# Patient Record
Sex: Female | Born: 1990 | Race: White | Hispanic: No | Marital: Married | Smoking: Current some day smoker
Health system: Southern US, Community
[De-identification: ages and names within clinical notes are randomized; demographics above are authoritative.]

## PROBLEM LIST (undated history)

## (undated) DIAGNOSIS — N12 Tubulo-interstitial nephritis, not specified as acute or chronic: Secondary | ICD-10-CM

## (undated) DIAGNOSIS — N946 Dysmenorrhea, unspecified: Secondary | ICD-10-CM

## (undated) DIAGNOSIS — T7840XA Allergy, unspecified, initial encounter: Secondary | ICD-10-CM

## (undated) DIAGNOSIS — F329 Major depressive disorder, single episode, unspecified: Secondary | ICD-10-CM

## (undated) DIAGNOSIS — F32A Depression, unspecified: Secondary | ICD-10-CM

## (undated) DIAGNOSIS — F419 Anxiety disorder, unspecified: Secondary | ICD-10-CM

## (undated) DIAGNOSIS — J45909 Unspecified asthma, uncomplicated: Secondary | ICD-10-CM

## (undated) HISTORY — DX: Depression, unspecified: F32.A

## (undated) HISTORY — DX: Allergy, unspecified, initial encounter: T78.40XA

## (undated) HISTORY — DX: Anxiety disorder, unspecified: F41.9

## (undated) HISTORY — PX: WISDOM TOOTH EXTRACTION: SHX21

## (undated) HISTORY — DX: Tubulo-interstitial nephritis, not specified as acute or chronic: N12

## (undated) HISTORY — DX: Major depressive disorder, single episode, unspecified: F32.9

## (undated) HISTORY — DX: Dysmenorrhea, unspecified: N94.6

## (undated) HISTORY — DX: Unspecified asthma, uncomplicated: J45.909

---
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
## 2008-04-06 ENCOUNTER — Ambulatory Visit: Payer: Self-pay | Admitting: Pediatrics

## 2008-08-29 ENCOUNTER — Ambulatory Visit: Payer: Self-pay | Admitting: Psychologist

## 2008-08-30 ENCOUNTER — Ambulatory Visit: Payer: Self-pay | Admitting: Psychologist

## 2008-09-06 ENCOUNTER — Ambulatory Visit: Payer: Self-pay | Admitting: Psychologist

## 2010-06-06 ENCOUNTER — Ambulatory Visit (HOSPITAL_COMMUNITY): Admission: RE | Admit: 2010-06-06 | Discharge: 2010-06-06 | Payer: Self-pay | Admitting: Gastroenterology

## 2011-02-19 ENCOUNTER — Other Ambulatory Visit: Payer: Self-pay | Admitting: Obstetrics and Gynecology

## 2011-02-19 ENCOUNTER — Other Ambulatory Visit (HOSPITAL_COMMUNITY)
Admission: RE | Admit: 2011-02-19 | Discharge: 2011-02-19 | Disposition: A | Discharge status: Home / Self Care | Payer: PRIVATE HEALTH INSURANCE | Source: Ambulatory Visit | Attending: Obstetrics and Gynecology | Admitting: Obstetrics and Gynecology

## 2011-02-19 DIAGNOSIS — Z01419 Encounter for gynecological examination (general) (routine) without abnormal findings: Secondary | ICD-10-CM | POA: Insufficient documentation

## 2011-02-19 DIAGNOSIS — Z113 Encounter for screening for infections with a predominantly sexual mode of transmission: Secondary | ICD-10-CM | POA: Insufficient documentation

## 2011-07-21 IMAGING — NM NM HEPATO W/GB/PHARM/[PERSON_NAME]
2 series · 12 of 12 positions shown · non-contrast
Comparison: None ultrasound 06/06/2010.

CLINICAL DATA: Abdominal pain.  Nausea.

NUCLEAR MEDICINE HEPATOBILIARY IMAGING WITH GALLBLADDER EF
TECHNIQUE: Sequential images of the abdomen were obtained [DATE]
minutes following intravenous administration of
radiopharmaceutical. After slow intravenous infusion of 1.6 uCg
Cholecystokinin, gallbladder ejection fraction was determined.
Radiopharmaceutical: 5.1 mCi Yc-JJm Choletec

[Series 1: he hepato · 4.71mm/px · 6 of 30 frames shown (1 of 2)]
[frame 3/30]
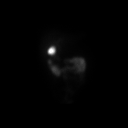
[frame 8/30]
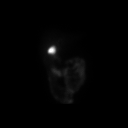
[frame 13/30]
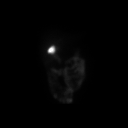
[frame 18/30]
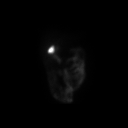
[frame 23/30]
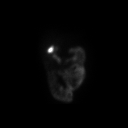
[frame 28/30]
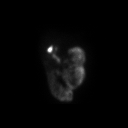

[Series 1: he hepato · 4.71mm/px · 6 of 60 frames shown (2 of 2)]
[frame 6/60]
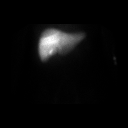
[frame 16/60]
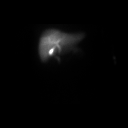
[frame 26/60]
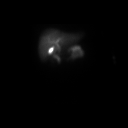
[frame 36/60]
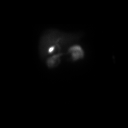
[frame 46/60]
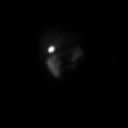
[frame 56/60]
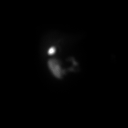

[12 of 12 positions shown; findings below may reference images not displayed]

FINDINGS: There is prompt visualization of hepatic activity. There
is prompt visualization of the common bile duct. Subsequently
intestinal activity was identified.

The gallbladder began to visualize at 7 minutes.

During CCK infusion the gallbladder contracted 63.7%. 30% or
greater is normal range.

During CCK infusion the patient reported experiencing abdominal
pain.
IMPRESSION: There is demonstration of patency of the common bile duct and the
cystic duct. There is no evidence of cholecystitis.

During CCK infusion the gallbladder contracted 63.7%. 30% or
greater is normal range.

During CCK infusion the patient reported experiencing abdominal
pain.

## 2013-02-28 ENCOUNTER — Other Ambulatory Visit: Payer: Self-pay | Admitting: Obstetrics and Gynecology

## 2013-02-28 ENCOUNTER — Other Ambulatory Visit (HOSPITAL_COMMUNITY)
Admission: RE | Admit: 2013-02-28 | Discharge: 2013-02-28 | Disposition: A | Discharge status: Home / Self Care | Payer: Commercial Managed Care - PPO | Source: Ambulatory Visit | Attending: Obstetrics and Gynecology | Admitting: Obstetrics and Gynecology

## 2013-02-28 DIAGNOSIS — Z113 Encounter for screening for infections with a predominantly sexual mode of transmission: Secondary | ICD-10-CM | POA: Insufficient documentation

## 2013-02-28 DIAGNOSIS — Z01419 Encounter for gynecological examination (general) (routine) without abnormal findings: Secondary | ICD-10-CM | POA: Insufficient documentation

## 2015-02-19 ENCOUNTER — Encounter: Payer: Self-pay | Admitting: Obstetrics and Gynecology

## 2015-04-12 ENCOUNTER — Encounter: Payer: Self-pay | Admitting: Obstetrics & Gynecology

## 2015-04-12 ENCOUNTER — Ambulatory Visit (INDEPENDENT_AMBULATORY_CARE_PROVIDER_SITE_OTHER): Payer: Commercial Managed Care - PPO | Admitting: Obstetrics & Gynecology

## 2015-04-12 VITALS — BP 120/80 | HR 88 | Resp 16 | Ht 63.75 in | Wt 208.0 lb

## 2015-04-12 DIAGNOSIS — Z01419 Encounter for gynecological examination (general) (routine) without abnormal findings: Secondary | ICD-10-CM | POA: Diagnosis not present

## 2015-04-12 DIAGNOSIS — Z202 Contact with and (suspected) exposure to infections with a predominantly sexual mode of transmission: Secondary | ICD-10-CM | POA: Diagnosis not present

## 2015-04-12 DIAGNOSIS — N946 Dysmenorrhea, unspecified: Secondary | ICD-10-CM

## 2015-04-12 DIAGNOSIS — Z124 Encounter for screening for malignant neoplasm of cervix: Secondary | ICD-10-CM

## 2015-04-12 MED ORDER — NORETHINDRONE-ETH ESTRADIOL 0.4-35 MG-MCG PO TABS: 1.0000 | ORAL_TABLET | Freq: Every day | ORAL | Status: DC

## 2015-04-12 NOTE — Progress Notes (Signed)
Patient ID: Rachel Paul, female   DOB: Feb 26, 1991, 26 y.o.   MRN: [id number]   23 y.o. G0 Single CaucasianF here for annual exam/new pt visit.  Pt needs to establish care and would like to restart OCPs.  Has been on OCPs. In the past and did well with this.  Switched to Nuva ring as she sometimes missed pills.  Reports she stopped the Nuva ring almost three years ago.  Since then has noted increased mid cycle pain and pain with menstrual cycles.    Patient's last menstrual period was 04/08/2015.          Sexually active: Yes.    The current method of family planning is condoms.    Exercising: Yes.    GYM Smoker:  yes  Health Maintenance: Pap:  02-28-13 WNL History of abnormal Pap:  no MMG:  Age 48 Colonoscopy:  Never BMD:   Never TDaP:  2015  Screening Labs: Dr Serita Grammes (PCP) does her labs    reports that she has been smoking.  She does not have any smokeless tobacco history on file. She reports that she drinks about 6.0 oz of alcohol per week. She reports that she does not use illicit drugs.  Past Medical History  Diagnosis Date  . Dysmenorrhea   . Anxiety   . Depression   . Asthma     Past Surgical History  Procedure Laterality Date  . Wisdom tooth extraction      Current Outpatient Prescriptions  Medication Sig Dispense Refill  . ALPRAZolam (XANAX) 0.25 MG tablet TAKE 1-2 TABLETS BY MOUTH TWICE A DAY AS NEEDED  0  . escitalopram (LEXAPRO) 10 MG tablet Take 10 mg by mouth daily.  6   No current facility-administered medications for this visit.    Family History  Problem Relation Age of Onset  . Thyroid disease Mother   . Asthma Mother   . Asthma Brother   . Ovarian cancer Maternal Grandmother   . Diabetes Maternal Grandmother   . Diabetes Maternal Grandfather   . Diabetes Paternal Grandfather     ROS:  Pertinent items are noted in HPI.  Otherwise, a comprehensive ROS was negative.  Exam:   BP 120/80 mmHg  Pulse 88  Resp 16  Ht 5' 3.75" (1.619 m)  Wt  208 lb (94.348 kg)  BMI 35.99 kg/m2  LMP 04/08/2015  Height: 5' 3.75" (161.9 cm) Ht Readings from Last 3 Encounters:  04/12/15 5' 3.75" (1.619 m)   General appearance: alert, cooperative and appears stated age Head: Normocephalic, without obvious abnormality, atraumatic Neck: no adenopathy, supple, symmetrical, trachea midline and thyroid normal to inspection and palpation Lungs: clear to auscultation bilaterally Breasts: normal appearance, no masses or tenderness Heart: regular rate and rhythm Abdomen: soft, non-tender; bowel sounds normal; no masses,  no organomegaly Extremities: extremities normal, atraumatic, no cyanosis or edema Skin: Skin color, texture, turgor normal. No rashes or lesions Lymph nodes: Cervical, supraclavicular, and axillary nodes normal. No abnormal inguinal nodes palpated Neurologic: Grossly normal   Pelvic: External genitalia:  no lesions              Urethra:  normal appearing urethra with no masses, tenderness or lesions              Bartholins and Skenes: normal                 Vagina: normal appearing vagina with normal color and discharge, no lesions  Cervix: no lesions              Pap taken: Yes.   Bimanual Exam:  Uterus:  normal size, contour, position, consistency, mobility, non-tender              Adnexa: normal adnexa and no mass, fullness, tenderness               Rectovaginal: Confirms               Anus:  normal sphincter tone, no lesions  Chaperone was present for exam.  A:  Well Woman with normal exam Smoker SA and desirous of restarting OCPs Depression/anxiety, on Lexapro Has completed Gardisil  P:   Mammogram starting age 74  pap smear obtained today RPR, HIV, GC/Chl today Fem con rx to pharmacy return annually or prn

## 2015-04-13 LAB — IPS PAP TEST WITH REFLEX TO HPV

## 2015-04-13 LAB — HIV ANTIBODY (ROUTINE TESTING W REFLEX): HIV: NONREACTIVE

## 2015-04-13 LAB — RPR

## 2015-04-14 LAB — IPS N GONORRHOEA AND CHLAMYDIA BY PCR

## 2015-04-16 ENCOUNTER — Telehealth: Payer: Self-pay

## 2015-04-16 NOTE — Telephone Encounter (Signed)
-----   Message from Megan Salon, MD sent at 04/14/2015 12:18 PM EDT ----- Inform HIV and RPR are negative.  H/O hep b vaccination so this was not tested.  GC/Chl still pending.

## 2015-04-16 NOTE — Telephone Encounter (Signed)
Lmtcb//kn 

## 2015-05-14 NOTE — Telephone Encounter (Signed)
Patient notified.//kn 

## 2015-05-25 ENCOUNTER — Encounter: Payer: Self-pay | Admitting: Physician Assistant

## 2015-05-28 ENCOUNTER — Encounter: Payer: Self-pay | Admitting: Nurse Practitioner

## 2015-05-28 ENCOUNTER — Ambulatory Visit (INDEPENDENT_AMBULATORY_CARE_PROVIDER_SITE_OTHER): Payer: Commercial Managed Care - PPO | Admitting: Nurse Practitioner

## 2015-05-28 ENCOUNTER — Telehealth: Payer: Self-pay | Admitting: Obstetrics & Gynecology

## 2015-05-28 VITALS — BP 108/66 | HR 76 | Temp 98.3°F | Ht 63.75 in | Wt 204.0 lb

## 2015-05-28 DIAGNOSIS — N760 Acute vaginitis: Secondary | ICD-10-CM

## 2015-05-28 MED ORDER — VALACYCLOVIR HCL 1 G PO TABS: 1000.0000 mg | ORAL_TABLET | Freq: Two times a day (BID) | ORAL | Status: DC

## 2015-05-28 MED ORDER — LIDOCAINE 5 % EX OINT: 1.0000 "application " | TOPICAL_OINTMENT | CUTANEOUS | Status: DC | PRN

## 2015-05-28 NOTE — Telephone Encounter (Signed)
Patient is having vaginal irritation with swelling. Patient is asking for a return call ASAP. Last seen 04/02/15.

## 2015-05-28 NOTE — Telephone Encounter (Signed)
Spoke with patient. Patient states that on 05/14/2015 she had intercourse. Feels she may have a tear or cut close to her clitoris. Vagina began to swell and become irritated. Was seen with PCP on Friday 7/29 and was given Diflucan and extremal cream. Patient states she has only gotten worse since taking Diflucan. "It feels like the area is bruised and numb. It is so swollen and it hurts to do anything. I am even having numbness into my leg now." Denies any fever or chills. States she is unable to see the area well. Advised will need to be seen in office today for further evaluation. Patient is agreeable. Appointment scheduled for 8/1 at 12:45pm with Milford Cage, [place]. Patient is agreeable to date and time.  Routing to provider for final review. Patient agreeable to disposition. Will close encounter.   Patient aware provider will review message and nurse will return call if any additional advice or change of disposition.

## 2015-05-28 NOTE — Progress Notes (Signed)
24 y.o.Single white G0P0 female here with complaint of vaginal symptoms of vulvar itching, burning. Describes painful areas around the clitoris following SA on 05/14/15.  This is a partner she had been with before but it has been over 3 years.  They did have oral sex.  She gradually noted a increase in pain and saw PCP on Friday 7/29 and was treated for a yeast infection with Diflucan and Nystatin.  She thought she maybe was having an allergic reaction to the cream secondary to increase in swelling and pain at the vulva.  She has also felt bruised and numb at the valva.  No urinary symptoms. Denies new personal products or vaginal dryness.  ? STD concerns.  Denies past history of HSV for herself and not sure about partner.  Contraception is OCP.   O: Healthy female WDWN Affect: anxiously, orientation x 3  Exam: alert, anxious, during most of exam was crying about the potential HSV diagnosis Abdomen: soft and non tender Lymph node: no enlargement or tenderness Pelvic exam: External genital: normal female with multiple areas of HSV lesions, the largest is the top of vulva at the clitoris on the right and bottom the labia on the left. BUS: negative Vagina: very little discharge noted. unable to insert a speculum Cervix: not examined secondary to pain Uterus: not examined secondary to pain 1+ lymph node bilateral inguinal  Will get STD panel Will check HSV IGG/ IGM Will follow with HSV C&S   A: R/O HSV  R/O STD's    P: Discussed findings of HSV and etiology. Discussed Aveeno or baking soda sitz bath for comfort. Avoid moist clothes or pads for extended period of time. If working out in gym clothes or swim suits for long periods of time change underwear or bottoms of swimsuit if possible. Olive Oil/Coconut Oil use for skin protection prior to activity can be used to external skin.  Rx: will start on Valtrex 1GM BID for 10 days  Rx: for Xylocaine ointment prn for pain  Recheck in 10 days  Will  do some labs today and plan on rest of STD's with vaginal swab at next visit  Comfort and support is given - she says she has Xanax at home to take prn. -offered to call a family member and she declines.  She was much better composed at time of discharge.  RV prn

## 2015-05-28 NOTE — Patient Instructions (Signed)
Genital Herpes °Genital herpes is a sexually transmitted disease. This means that it is a disease passed by having sex with an infected person. There is no cure for genital herpes. The time between attacks can be months to years. The virus may live in a person but produce no problems (symptoms). This infection can be passed to a baby as it travels down the birth canal (vagina). In a newborn, this can cause central nervous system damage, eye damage, or even death. The virus that causes genital herpes is usually HSV-2 virus. The virus that causes oral herpes is usually HSV-1. The diagnosis (learning what is wrong) is made through culture results. °SYMPTOMS  °Usually symptoms of pain and itching begin a few days to a week after contact. It first appears as small blisters that progress to small painful ulcers which then scab over and heal after several days. It affects the outer genitalia, birth canal, cervix, penis, anal area, buttocks, and thighs. °HOME CARE INSTRUCTIONS  °· Keep ulcerated areas dry and clean. °· Take medications as directed. Antiviral medications can speed up healing. They will not prevent recurrences or cure this infection. These medications can also be taken for suppression if there are frequent recurrences. °· While the infection is active, it is contagious. Avoid all sexual contact during active infections. °· Condoms may help prevent spread of the herpes virus. °· Practice safe sex. °· Wash your hands thoroughly after touching the genital area. °· Avoid touching your eyes after touching your genital area. °· Inform your caregiver if you have had genital herpes and become pregnant. It is your responsibility to insure a safe outcome for your baby in this pregnancy. °· Only take over-the-counter or prescription medicines for pain, discomfort, or fever as directed by your caregiver. °SEEK MEDICAL CARE IF:  °· You have a recurrence of this infection. °· You do not respond to medications and are not  improving. °· You have new sources of pain or discharge which have changed from the original infection. °· You have an oral temperature above 102° F (38.9° C). °· You develop abdominal pain. °· You develop eye pain or signs of eye infection. °Document Released: 10/10/2000 Document Revised: 01/05/2012 Document Reviewed: 10/31/2009 °Exit[employer]® Patient Information ©2015 [employer]. This information is not intended to replace advice given to you by your health care provider. Make sure you discuss any questions you have with your health care provider. ° °

## 2015-05-29 LAB — STD PANEL
HIV 1&2 Ab, 4th Generation: NONREACTIVE
Hepatitis B Surface Ag: NEGATIVE

## 2015-05-29 LAB — HSV(HERPES SMPLX)ABS-I+II(IGG+IGM)-BLD
HSV 1 GLYCOPROTEIN G AB, IGG: 0.85 IV
Herpes Simplex Vrs I&II-IgM Ab (EIA): 1.34 INDEX — ABNORMAL HIGH

## 2015-05-30 ENCOUNTER — Telehealth: Payer: Self-pay

## 2015-05-30 LAB — HERPES SIMPLEX VIRUS CULTURE: ORGANISM ID, BACTERIA: DETECTED

## 2015-05-30 NOTE — Telephone Encounter (Signed)
Spoke with patient. Results and message given as seen below. Patient is agreeable and verbalizes understanding. States she is doing well. Denies any current problems. Has 10 day follow up scheduled for 06/06/2015 with Milford Cage, FNP.  Routing to provider for final review. Patient agreeable to disposition. Will close encounter.   Patient aware provider will review message and nurse will return call if any additional advice or change of disposition.

## 2015-05-30 NOTE — Telephone Encounter (Signed)
-----   Message from Kem [place], [place] sent at 05/30/2015 10:29 AM EDT ----- Please let pt. 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  The Herpes blood test shows this is a new infection with a + IGM.  This is not a past infection as the IGG is low. The STD panel is negative with Hep B, HIV, STS.  Get a progress report - both physically and emotionally is she doing OK.  Still want her to come back in the 10 days as already discussed.  Hopefully the Valtrex and xylocaine is helping to reduce the pain.

## 2015-05-30 NOTE — Telephone Encounter (Signed)
Left message to call Kaitlyn at [phone]. 

## 2015-06-02 NOTE — Progress Notes (Signed)
Encounter reviewed by Dr. Brook Amundson C. Silva.  

## 2015-06-06 ENCOUNTER — Encounter: Payer: Self-pay | Admitting: Nurse Practitioner

## 2015-06-06 ENCOUNTER — Ambulatory Visit (INDEPENDENT_AMBULATORY_CARE_PROVIDER_SITE_OTHER): Payer: Commercial Managed Care - PPO | Admitting: Nurse Practitioner

## 2015-06-06 VITALS — BP 110/72 | HR 66 | Ht 63.75 in | Wt 206.1 lb

## 2015-06-06 DIAGNOSIS — Z113 Encounter for screening for infections with a predominantly sexual mode of transmission: Secondary | ICD-10-CM

## 2015-06-06 MED ORDER — VALACYCLOVIR HCL 1 G PO TABS: ORAL_TABLET | ORAL | Status: DC

## 2015-06-06 NOTE — Patient Instructions (Signed)
Will now take Valtrex daily and then if flare will take 1 tablet twice a day for 3-5 days.

## 2015-06-06 NOTE — Progress Notes (Signed)
24 y.o. Rachel KitchenSingle white G0P0 female here for a follow up of new diagnosis of HSV I proven vulvar culture from 05/28/2015.  She has completed the Valtrex and reports her symptoms are so much better.  She did have partner to go for testing and he also tested + for HSV I but not for HSV II.  They are not dating now but happened to be SA on that one occasion on 05/14/15. Rest of her STD's are reviewed and she was aware of the results. Contraception is OCP.   O: Healthy WD,WN female Affect: much improved - not tearful or upset Abdomen:  Soft and non tender Pelvic exam:  Lesions at the clitoris and introitus are almost gone.  Slight redness only. Specimen for GC and chlamydia are collected.  No pain on bimanual.   A:  New diagnosis of HSV I   R/O STD's    P: Discussed findings of other STD's and will follow  Will start on maintenance dose of Valtrex 500 mg daily and prn for flare   Labs: GC & Chl   Instructions given regarding: prn flares of HSV  About STD exposure risk with new partner and informing new partner of this history  RV

## 2015-06-08 LAB — IPS N GONORRHOEA AND CHLAMYDIA BY PCR

## 2015-06-12 NOTE — Progress Notes (Signed)
Encounter reviewed by Dr. Ajayla Iglesias Amundson C. Silva.  

## 2016-07-08 ENCOUNTER — Ambulatory Visit: Payer: Commercial Managed Care - PPO | Admitting: Obstetrics & Gynecology

## 2017-02-21 ENCOUNTER — Emergency Department (HOSPITAL_BASED_OUTPATIENT_CLINIC_OR_DEPARTMENT_OTHER)
Admission: EM | Admit: 2017-02-21 | Discharge: 2017-02-21 | Disposition: A | Discharge status: Home / Self Care | Payer: BLUE CROSS/BLUE SHIELD | Attending: Emergency Medicine | Admitting: Emergency Medicine

## 2017-02-21 ENCOUNTER — Encounter (HOSPITAL_BASED_OUTPATIENT_CLINIC_OR_DEPARTMENT_OTHER): Payer: Self-pay | Admitting: Emergency Medicine

## 2017-02-21 ENCOUNTER — Emergency Department (HOSPITAL_BASED_OUTPATIENT_CLINIC_OR_DEPARTMENT_OTHER): Payer: BLUE CROSS/BLUE SHIELD

## 2017-02-21 DIAGNOSIS — D259 Leiomyoma of uterus, unspecified: Secondary | ICD-10-CM | POA: Diagnosis not present

## 2017-02-21 DIAGNOSIS — J45909 Unspecified asthma, uncomplicated: Secondary | ICD-10-CM | POA: Diagnosis not present

## 2017-02-21 DIAGNOSIS — F17200 Nicotine dependence, unspecified, uncomplicated: Secondary | ICD-10-CM | POA: Diagnosis not present

## 2017-02-21 DIAGNOSIS — F1290 Cannabis use, unspecified, uncomplicated: Secondary | ICD-10-CM | POA: Insufficient documentation

## 2017-02-21 DIAGNOSIS — R1031 Right lower quadrant pain: Secondary | ICD-10-CM | POA: Diagnosis present

## 2017-02-21 DIAGNOSIS — N12 Tubulo-interstitial nephritis, not specified as acute or chronic: Secondary | ICD-10-CM | POA: Insufficient documentation

## 2017-02-21 LAB — BASIC METABOLIC PANEL
Anion gap: 8 (ref 5–15)
BUN: 12 mg/dL (ref 6–20)
CALCIUM: 9.5 mg/dL (ref 8.9–10.3)
CO2: 27 mmol/L (ref 22–32)
Chloride: 102 mmol/L (ref 101–111)
Creatinine, Ser: 0.65 mg/dL (ref 0.44–1.00)
Glucose, Bld: 89 mg/dL (ref 65–99)
POTASSIUM: 4.2 mmol/L (ref 3.5–5.1)
SODIUM: 137 mmol/L (ref 135–145)

## 2017-02-21 LAB — URINALYSIS, ROUTINE W REFLEX MICROSCOPIC
Bilirubin Urine: NEGATIVE
Glucose, UA: NEGATIVE mg/dL
KETONES UR: NEGATIVE mg/dL
NITRITE: NEGATIVE
Protein, ur: NEGATIVE mg/dL
Specific Gravity, Urine: 1.015 (ref 1.005–1.030)
pH: 5 (ref 5.0–8.0)

## 2017-02-21 LAB — CBC WITH DIFFERENTIAL/PLATELET
BASOS PCT: 0 %
Basophils Absolute: 0 10*3/uL (ref 0.0–0.1)
EOS ABS: 0.1 10*3/uL (ref 0.0–0.7)
Eosinophils Relative: 1 %
HCT: 35.2 % — ABNORMAL LOW (ref 36.0–46.0)
HEMOGLOBIN: 12.1 g/dL (ref 12.0–15.0)
Lymphocytes Relative: 33 %
Lymphs Abs: 3.1 10*3/uL (ref 0.7–4.0)
MCH: 31.6 pg (ref 26.0–34.0)
MCHC: 34.4 g/dL (ref 30.0–36.0)
MCV: 91.9 fL (ref 78.0–100.0)
MONO ABS: 0.6 10*3/uL (ref 0.1–1.0)
MONOS PCT: 6 %
Neutro Abs: 5.6 10*3/uL (ref 1.7–7.7)
Neutrophils Relative %: 60 %
Platelets: 273 10*3/uL (ref 150–400)
RBC: 3.83 MIL/uL — ABNORMAL LOW (ref 3.87–5.11)
RDW: 11.9 % (ref 11.5–15.5)
WBC: 9.3 10*3/uL (ref 4.0–10.5)

## 2017-02-21 LAB — URINALYSIS, MICROSCOPIC (REFLEX)

## 2017-02-21 LAB — PREGNANCY, URINE: Preg Test, Ur: NEGATIVE

## 2017-02-21 MED ORDER — MORPHINE SULFATE (PF) 4 MG/ML IV SOLN
4.0000 mg | Freq: Once | INTRAVENOUS | Status: AC
  Administered 2017-02-21: 4 mg via INTRAVENOUS
  Filled 2017-02-21: qty 1

## 2017-02-21 MED ORDER — SODIUM CHLORIDE 0.9 % IV BOLUS (SEPSIS)
1000.0000 mL | Freq: Once | INTRAVENOUS | Status: AC
  Administered 2017-02-21: 1000 mL via INTRAVENOUS

## 2017-02-21 MED ORDER — DEXTROSE 5 % IV SOLN
1.0000 g | Freq: Once | INTRAVENOUS | Status: AC
  Administered 2017-02-21: 1 g via INTRAVENOUS
  Filled 2017-02-21: qty 10

## 2017-02-21 MED ORDER — IBUPROFEN 600 MG PO TABS: 600.0000 mg | ORAL_TABLET | Freq: Three times a day (TID) | ORAL | 0 refills | Status: DC | PRN

## 2017-02-21 MED ORDER — KETOROLAC TROMETHAMINE 15 MG/ML IJ SOLN
15.0000 mg | Freq: Once | INTRAMUSCULAR | Status: AC
  Administered 2017-02-21: 15 mg via INTRAVENOUS
  Filled 2017-02-21: qty 1

## 2017-02-21 MED ORDER — ONDANSETRON HCL 4 MG PO TABS: 4.0000 mg | ORAL_TABLET | Freq: Three times a day (TID) | ORAL | 0 refills | Status: DC | PRN

## 2017-02-21 MED ORDER — ONDANSETRON HCL 4 MG/2ML IJ SOLN
4.0000 mg | Freq: Once | INTRAMUSCULAR | Status: AC
  Administered 2017-02-21: 4 mg via INTRAVENOUS
  Filled 2017-02-21: qty 2

## 2017-02-21 MED ORDER — CEPHALEXIN 500 MG PO CAPS: 500.0000 mg | ORAL_CAPSULE | Freq: Four times a day (QID) | ORAL | 0 refills | Status: AC

## 2017-02-21 NOTE — ED Notes (Signed)
Patient transported to CT 

## 2017-02-21 NOTE — ED Triage Notes (Signed)
RLQ pain radiating to back since noon today. Reports burning with urination and frequency a few days ago and started taking some left over doxycycline that she had home x 2 days. Denies N/v

## 2017-02-21 NOTE — ED Provider Notes (Signed)
[facility] DEPT [facility] Provider Note   CSN: [id number] Arrival date & time: 02/21/17  1637  By signing my name below, I, [name], attest that this documentation has been prepared under the direction and in the presence of physician practitioner, Duffy Bruce, MD. Electronically Signed: [name], Scribe. 02/21/2017. 5:04 PM.  History   Chief Complaint Chief Complaint  Patient presents with  . Abdominal Pain    The history is provided by the patient. No language interpreter was used.     HPI Comments: Rachel Paul is a 26 y.o. female who presents to the Emergency Department complaining of intermittent, gradually worsening, right lower back pain radiating into the RLQ beginning around noon today. Patient states she developed UTI-like symptoms two days ago, including dysuria, hematuria, urinary frequency, and subjective fevers/chills. She has leftover doxycycline at home and has taken it with complete resolution of her UTI-like symptoms. She has a h/o ovarian cysts and states the quality of her current pain feels similar, but is not in the same location as that which she experiences with this condition. Her last BM was earlier today and was normal. She has no personal h/o kidney stones but does have a FMHx of kidney stones. Patient is currently menstruating and denies any abnormal cramping or vaginal bleeding. No h/o abdominal surgeries. No h/o DM. Patient is allergic to penicillins and azithromycin (hives). She denies cough, nausea, vomiting, diarrhea, or any other associated symptoms.  Past Medical History:  Diagnosis Date  . Anxiety   . Asthma   . Depression   . Dysmenorrhea     Patient Active Problem List   Diagnosis Date Noted  . Dysmenorrhea 04/12/2015    Past Surgical History:  Procedure Laterality Date  . WISDOM TOOTH EXTRACTION      OB History    Gravida Para Term Preterm AB Living   0             SAB TAB Ectopic Multiple Live Births                     Home Medications    Prior to Admission medications   Medication Sig Start Date End Date Taking? Authorizing Provider  ALPRAZolam (XANAX) 0.25 MG tablet TAKE 1-2 TABLETS BY MOUTH TWICE A DAY AS NEEDED 03/23/15   Historical Provider, MD  cephALEXin (KEFLEX) 500 MG capsule Take 1 capsule (500 mg total) by mouth 4 (four) times daily. 02/21/17 03/03/17  Duffy Bruce, MD  escitalopram (LEXAPRO) 10 MG tablet Take 10 mg by mouth daily. 04/02/15   Historical Provider, MD  ibuprofen (ADVIL,MOTRIN) 600 MG tablet Take 1 tablet (600 mg total) by mouth every 8 (eight) hours as needed for moderate pain. 02/21/17   Duffy Bruce, MD  lidocaine (XYLOCAINE) 5 % ointment Apply 1 application topically as needed. 05/28/15   Kem Boroughs, FNP  norethindrone-ethinyl estradiol Pecolia Ades) 0.4-35 MG-MCG tablet Take 1 tablet by mouth daily. 04/12/15   Megan Salon, MD  ondansetron (ZOFRAN) 4 MG tablet Take 1 tablet (4 mg total) by mouth every 8 (eight) hours as needed for nausea or vomiting. 02/21/17   Duffy Bruce, MD    Family History Family History  Problem Relation Age of Onset  . Thyroid disease Mother   . Asthma Mother   . Asthma Brother   . Ovarian cancer Maternal Grandmother   . Diabetes Maternal Grandmother   . Diabetes Maternal Grandfather   . Diabetes Paternal Grandfather     Social History Social  History  Substance Use Topics  . Smoking status: Current Every Day Smoker  . Smokeless tobacco: Never Used     Comment: patient says she smokes 1 per day - smokes more if she's drinkning  . Alcohol use 6.0 oz/week    10 Standard drinks or equivalent per week     Allergies   Amoxicillin; Azithromycin; and Levaquin [levofloxacin in d5w]   Review of Systems Review of Systems  Constitutional: Negative for chills and fever.  HENT: Negative for congestion, rhinorrhea and sore throat.   Eyes: Negative for visual disturbance.  Respiratory: Negative for cough, shortness of breath  and wheezing.   Cardiovascular: Negative for chest pain and leg swelling.  Gastrointestinal: Positive for abdominal pain. Negative for diarrhea, nausea and vomiting.  Genitourinary: Positive for flank pain (R). Negative for dysuria, vaginal bleeding, vaginal discharge and vaginal pain.  Musculoskeletal: Positive for back pain. Negative for neck pain.  Skin: Negative for rash.  Allergic/Immunologic: Negative for immunocompromised state.  Neurological: Negative for syncope and headaches.  Hematological: Does not bruise/bleed easily.  All other systems reviewed and are negative.  Physical Exam Updated Vital Signs BP 112/77 (BP Location: Left Arm)   Pulse 78   Temp 98.9 F (37.2 C) (Oral)   Resp 18   Ht 5\' 3"  (1.6 m)   Wt 185 lb (83.9 kg)   LMP 02/18/2017   SpO2 100%   BMI 32.77 kg/m   Physical Exam  Constitutional: She is oriented to person, place, and time. She appears well-developed and well-nourished. No distress.  HENT:  Head: Normocephalic and atraumatic.  Eyes: Conjunctivae are normal. Pupils are equal, round, and reactive to light.  Neck: Neck supple.  Cardiovascular: Normal rate, regular rhythm and normal heart sounds.  Exam reveals no friction rub.   No murmur heard. Pulmonary/Chest: Effort normal and breath sounds normal. No respiratory distress. She has no wheezes. She has no rales.  Abdominal: Soft. She exhibits no distension. There is tenderness (moderate right upper quadrant and right lower quadrant pain; no guarding; no CVAT). There is no rebound and no guarding.  Musculoskeletal: She exhibits no edema.  Neurological: She is alert and oriented to person, place, and time. She exhibits normal muscle tone.  Skin: Skin is warm. Capillary refill takes less than 2 seconds.  Psychiatric: She has a normal mood and affect.  Nursing note and vitals reviewed.  ED Treatments / Results  Labs (all labs ordered are listed, but only abnormal results are displayed) Labs  Reviewed  URINALYSIS, ROUTINE W REFLEX MICROSCOPIC - Abnormal; Notable for the following:       Result Value   APPearance CLOUDY (*)    Hgb urine dipstick MODERATE (*)    Leukocytes, UA TRACE (*)    All other components within normal limits  CBC WITH DIFFERENTIAL/PLATELET - Abnormal; Notable for the following:    RBC 3.83 (*)    HCT 35.2 (*)    All other components within normal limits  URINALYSIS, MICROSCOPIC (REFLEX) - Abnormal; Notable for the following:    Bacteria, UA MANY (*)    Squamous Epithelial / LPF 6-30 (*)    All other components within normal limits  URINE CULTURE  PREGNANCY, URINE  BASIC METABOLIC PANEL    EKG  EKG Interpretation None       Radiology Ct Renal Stone Study  Result Date: 02/21/2017 CLINICAL DATA:  Right lower quadrant pain radiating to the back beginning around noon today. Pain with urination and urinary frequency. EXAM:  CT ABDOMEN AND PELVIS WITHOUT CONTRAST TECHNIQUE: Multidetector CT imaging of the abdomen and pelvis was performed following the standard protocol without IV contrast. COMPARISON:  None. FINDINGS: Lower chest: Lung bases are clear.  Heart is normal in size. Hepatobiliary: No focal liver abnormality is seen. No gallstones, gallbladder wall thickening, or biliary dilatation. Pancreas: Unremarkable. No pancreatic ductal dilatation or surrounding inflammatory changes. Spleen: Normal in size without focal abnormality. Adrenals/Urinary Tract: Normal adrenal glands. The kidneys are normal in size, orientation and position. No renal masses or stones. No hydronephrosis. Normal ureters. Normal bladder. Stomach/Bowel: Stomach and small bowel are unremarkable. There is mild generalized increased stool throughout the colon. No colonic wall thickening or inflammation. Normal appendix is visualized. Vascular/Lymphatic: No significant vascular findings are present. No enlarged abdominal or pelvic lymph nodes. Reproductive: The vagina is distended by what  appears to be a large tampon. There is an exophytic mass consistent with a fibroid protruding superiorly and anteriorly from the left uterine fundus measuring 3.2 x 2.2 x 3.0 cm. No other uterine masses. No adnexal/ovarian masses. Other: No abdominal wall hernia or abnormality. No abdominopelvic ascites. Musculoskeletal: No acute or significant osseous findings. IMPRESSION: 1. No acute findings. No findings to account for the patient's symptoms. 2. No renal or ureteral stones. 3. Normal appendix visualized. 4. Mild increased stool is noted throughout the colon. No colonic inflammation. 5. 3.2 cm exophytic uterine fibroid. Electronically Signed   By: Lajean Manes M.D.   On: 02/21/2017 18:02    Procedures Procedures (including critical care time)  DIAGNOSTIC STUDIES: Oxygen Saturation is 100% on RA, normal by my interpretation.    COORDINATION OF CARE: 5:12 PM Discussed treatment plan with pt at bedside and pt agreed to plan.  Medications Ordered in ED Medications  sodium chloride 0.9 % bolus 1,000 mL (0 mLs Intravenous Stopped 02/21/17 1836)  cefTRIAXone (ROCEPHIN) 1 g in dextrose 5 % 50 mL IVPB (0 g Intravenous Stopped 02/21/17 1832)  ketorolac (TORADOL) 15 MG/ML injection 15 mg (15 mg Intravenous Given 02/21/17 1737)  morphine 4 MG/ML injection 4 mg (4 mg Intravenous Given 02/21/17 1738)  ondansetron (ZOFRAN) injection 4 mg (4 mg Intravenous Given 02/21/17 1738)     Initial Impression / Assessment and Plan / ED Course  I have reviewed the triage vital signs and the nursing notes.  Pertinent labs & imaging results that were available during my care of the patient were reviewed by me and considered in my medical decision making (see chart for details).     26 yo F here with right-sided flank pain in setting of preceding dysuria and frequency. On doxy x 2 days that she had left over at home. Suspect mild pyelo, partially treated, with subsequent flank pain. Labs overall reassuring - WBC  normal, BMP with normal renal function. UA without pyuria but wit many bacteria, and pt on doxy as mentioned. CT stone without signs of stone, obstruction, or other acute abnormality. Sx improved with iVF, analgesia. No vaginal bleeding, discharge, and history not c/w PID, TOA, or torsion. Will tx with keflex, outpt follow-up.  Final Clinical Impressions(s) / ED Diagnoses   Final diagnoses:  Pyelonephritis  Uterine leiomyoma, unspecified location    New Prescriptions Discharge Medication List as of 02/21/2017  6:22 PM    START taking these medications   Details  cephALEXin (KEFLEX) 500 MG capsule Take 1 capsule (500 mg total) by mouth 4 (four) times daily., Starting Sat 02/21/2017, Until Tue 03/03/2017, Print    ibuprofen (ADVIL,MOTRIN)  600 MG tablet Take 1 tablet (600 mg total) by mouth every 8 (eight) hours as needed for moderate pain., Starting Sat 02/21/2017, Print    ondansetron (ZOFRAN) 4 MG tablet Take 1 tablet (4 mg total) by mouth every 8 (eight) hours as needed for nausea or vomiting., Starting Sat 02/21/2017, Print       I personally performed the services described in this documentation, which was scribed in my presence. The recorded information has been reviewed and is accurate.     Duffy Bruce, MD 02/22/17 1350

## 2017-02-21 NOTE — ED Notes (Signed)
Pt ambulatory to room without assistance, in NAD.

## 2017-02-23 LAB — URINE CULTURE: Special Requests: NORMAL

## 2017-03-11 ENCOUNTER — Telehealth: Payer: Self-pay | Admitting: Obstetrics & Gynecology

## 2017-03-11 NOTE — Telephone Encounter (Signed)
Spoke with patient. Patient states she was in ED on 02/21/17 for "kidney problems", had CT scan and was advised a fibroid was noted. Patient states she needs to schedule milAEX. Reports spotting between cycles and cramping, denies any other complainants. Reports last day of menses on 4/28. Patient scheduled for AEX on 5/17 at 3pm with Dr. Sabra Heck. Patient verbalizes understanding and is agreeable. Last OV 06/06/15.  Routing to provider for final review. Patient is agreeable to disposition. Will close encounter.

## 2017-03-11 NOTE — Telephone Encounter (Signed)
Patient called and requested an appointment with Dr. Sabra Heck re: "a uterine fibroid found during a recent CT scan."  Patient last seen: 06/06/15

## 2017-03-12 ENCOUNTER — Encounter: Payer: Self-pay | Admitting: Obstetrics & Gynecology

## 2017-03-12 ENCOUNTER — Other Ambulatory Visit (HOSPITAL_COMMUNITY)
Admission: RE | Admit: 2017-03-12 | Discharge: 2017-03-12 | Disposition: A | Discharge status: Home / Self Care | Payer: BLUE CROSS/BLUE SHIELD | Source: Ambulatory Visit | Attending: Obstetrics & Gynecology | Admitting: Obstetrics & Gynecology

## 2017-03-12 ENCOUNTER — Ambulatory Visit (INDEPENDENT_AMBULATORY_CARE_PROVIDER_SITE_OTHER): Payer: BLUE CROSS/BLUE SHIELD | Admitting: Obstetrics & Gynecology

## 2017-03-12 VITALS — BP 120/60 | HR 102 | Resp 18 | Ht 63.5 in | Wt 193.0 lb

## 2017-03-12 DIAGNOSIS — Z124 Encounter for screening for malignant neoplasm of cervix: Secondary | ICD-10-CM

## 2017-03-12 DIAGNOSIS — Z01419 Encounter for gynecological examination (general) (routine) without abnormal findings: Secondary | ICD-10-CM

## 2017-03-12 DIAGNOSIS — N898 Other specified noninflammatory disorders of vagina: Secondary | ICD-10-CM | POA: Diagnosis not present

## 2017-03-12 DIAGNOSIS — D252 Subserosal leiomyoma of uterus: Secondary | ICD-10-CM

## 2017-03-12 MED ORDER — NAPROXEN 500 MG PO TABS: 500.0000 mg | ORAL_TABLET | Freq: Two times a day (BID) | ORAL | 1 refills | Status: DC

## 2017-03-12 NOTE — Progress Notes (Signed)
26 y.o. [name] here for annual exam.  Pt was seen in the ER 02/21/17 due to pyelonephritis.  She was self treating for a UTI with doxycycline.  She was treated with IV antibiotics and then with PO antibiotics for several weeks.  Symptoms have completely resolved.  Pt had CT scan in ER that showed a uterine fibroid.  She wants to talk about this today.  Fibroid was 3.2 x 2.2 x 3.0cm.  Location is superiorly and anteriorly from the left fundus.  She has been on pills in the past.  States she forgets them a lot and so she has a lot of irregular bleeding due to this.  She has used the nuva ring but had recurrent vaginitis.    Hasn't been on a pill for two years.  Cycles are regular.  Flow lasts 5-6 days with four of these being heavy.  On the worse days, she uses a diva cup and will empty it three times a day.  She does pass clots as well.    Together with current partner for over a year.  He has had STD testing because of his work.  Declines testing today.  However, feels has recurrent "yeast" due to itching after every episode of intercourse.  Denies using condoms or any topical products.  She has used OTC yeast ovules but states she always feels there is some itching.    Patient's last menstrual period was 02/18/2017.          Sexually active: Yes.    The current method of family planning is coitus interruptus.    Exercising: Yes.    yoga daily, gym Smoker:  yes  Health Maintenance: Pap:  04/12/15 Neg   02/28/13 Neg  History of abnormal Pap:  no MMG: None TDaP:  2015  Gardasil: Completed  Screening Labs: PCP   reports that she has been smoking.  She has never used smokeless tobacco. She reports that she drinks about 0.6 oz of alcohol per week . She reports that she uses drugs, including Marijuana.  Past Medical History:  Diagnosis Date  . Anxiety   . Asthma   . Depression   . Dysmenorrhea     Past Surgical History:  Procedure Laterality Date  . WISDOM TOOTH EXTRACTION       Current Outpatient Prescriptions  Medication Sig Dispense Refill  . ibuprofen (ADVIL,MOTRIN) 600 MG tablet Take 1 tablet (600 mg total) by mouth every 8 (eight) hours as needed for moderate pain. 20 tablet 0  . lidocaine (XYLOCAINE) 5 % ointment Apply 1 application topically as needed. 35.44 g 0   No current facility-administered medications for this visit.     Family History  Problem Relation Age of Onset  . Thyroid disease Mother   . Asthma Mother   . Asthma Brother   . Ovarian cancer Maternal Grandmother   . Diabetes Maternal Grandmother   . Diabetes Maternal Grandfather   . Diabetes Paternal Grandfather     ROS:  Pertinent items are noted in HPI.  Otherwise, a comprehensive ROS was negative.  Exam:   BP 120/60 (BP Location: Right Arm, Patient Position: Sitting, Cuff Size: Normal)   Pulse (!) 102   Resp 18   Ht 5' 3.5" (1.613 m)   Wt 193 lb (87.5 kg)   LMP 02/18/2017   BMI 33.65 kg/m   Weight change: - 13# Height: 5' 3.5" (161.3 cm)  Ht Readings from Last 3 Encounters:  03/12/17 5' 3.5" (1.613 m)  02/21/17 5\' 3"  (1.6 m)  06/06/15 5' 3.75" (1.619 m)    General appearance: alert, cooperative and appears stated age Head: Normocephalic, without obvious abnormality, atraumatic Neck: no adenopathy, supple, symmetrical, trachea midline and thyroid normal to inspection and palpation Lungs: clear to auscultation bilaterally Breasts: normal appearance, no masses or tenderness Heart: regular rate and rhythm Abdomen: soft, non-tender; bowel sounds normal; no masses,  no organomegaly Extremities: extremities normal, atraumatic, no cyanosis or edema Skin: Skin color, texture, turgor normal. No rashes or lesions Lymph nodes: Cervical, supraclavicular, and axillary nodes normal. No abnormal inguinal nodes palpated Neurologic: Grossly normal   Pelvic: External genitalia:  Thickened appearing tissue at introitus c/w chronic itching              Urethra:  normal appearing  urethra with no masses, tenderness or lesions              Bartholins and Skenes: normal                 Vagina: normal appearing vagina with normal color and discharge, no lesions              Cervix: no lesions              Pap taken: Yes.   Bimanual Exam:  Uterus:  enlarged, 8 weeks size, slightly enlarged off to left              Adnexa: normal adnexa and no mass, fullness, tenderness               Rectovaginal: Confirms               Anus:  normal sphincter tone, no lesions  Chaperone was present for exam.  A:  Well Woman with normal exam Uterine fibroid, 3.2cm left fundal Social smoker Dysmenorrhea Vaginitis, chronic?  P:   Mammogram guidelines reviewed  pap smear obtained today Affirm pending naparosyn 500mg  bid prn cramping.  D/w pt other options.  She has considered Nexplanon but feels relationship is at the place where they may consider trying for pregnancy within the next 1-2 years.  Therefore she feels a longer acting contraception may not be necessary.   D/W pt, at this point, do not feel additional evaluation of the fibroid is needed.  Likely this will not affect ability to conceive or pregnancy due to location  return annually or prn  In additional to AEX, about 15 minutes spent discussing CT findings, impact on pregnancy, when to reimage, and chronic vaginitis symptoms as well as evaluation

## 2017-03-12 NOTE — Patient Instructions (Signed)
Rachel Paul, [facility]

## 2017-03-13 LAB — WET PREP BY MOLECULAR PROBE
Candida species: NOT DETECTED
GARDNERELLA VAGINALIS: NOT DETECTED
Trichomonas vaginosis: NOT DETECTED

## 2017-03-13 LAB — CYTOLOGY - PAP: Diagnosis: NEGATIVE

## 2017-03-16 ENCOUNTER — Other Ambulatory Visit: Payer: Self-pay | Admitting: *Deleted

## 2017-03-16 MED ORDER — HYDROCORTISONE ACETATE 25 MG RE SUPP: RECTAL | 0 refills | Status: DC

## 2017-03-27 ENCOUNTER — Ambulatory Visit: Payer: Self-pay | Admitting: Obstetrics & Gynecology

## 2017-04-03 ENCOUNTER — Ambulatory Visit (INDEPENDENT_AMBULATORY_CARE_PROVIDER_SITE_OTHER): Payer: BLUE CROSS/BLUE SHIELD | Admitting: Obstetrics & Gynecology

## 2017-04-03 VITALS — BP 102/60 | HR 88 | Temp 98.6°F | Resp 14 | Ht 63.5 in | Wt 194.0 lb

## 2017-04-03 DIAGNOSIS — A609 Anogenital herpesviral infection, unspecified: Secondary | ICD-10-CM

## 2017-04-03 DIAGNOSIS — R102 Pelvic and perineal pain: Secondary | ICD-10-CM

## 2017-04-03 LAB — POCT URINALYSIS DIPSTICK
BILIRUBIN UA: NEGATIVE
GLUCOSE UA: NEGATIVE
Ketones, UA: NEGATIVE
Nitrite, UA: NEGATIVE
Protein, UA: NEGATIVE
UROBILINOGEN UA: 0.2 U/dL
pH, UA: 5 (ref 5.0–8.0)

## 2017-04-03 LAB — POCT URINE PREGNANCY: Preg Test, Ur: NEGATIVE

## 2017-04-03 MED ORDER — VALACYCLOVIR HCL 500 MG PO TABS: ORAL_TABLET | ORAL | 2 refills | Status: DC

## 2017-04-03 MED ORDER — LIDOCAINE 5 % EX OINT: 1.0000 "application " | TOPICAL_OINTMENT | CUTANEOUS | 0 refills | Status: DC

## 2017-04-03 NOTE — Progress Notes (Signed)
GYNECOLOGY  VISIT   HPI: 26 y.o. G0P0 Single Caucasian female here for complaint of issues with recurrent vaginitis that she states is recurrent yeast.  She was tested for yeast and this was negative.  Due to erythema noted on day of exam, she was started on vaginal hydrocortisone suppositories.  After two days, she started having significant vulvar pain that felt like an HSV outbreak.  She's only had one other.  She felt this was due to the hydrocortisone suppositories, so she stopped them.  She is having some slight discharge.  She is having vulvar pain.  Denies fever or flu like symptoms.  Denies low grade fever.  Pt had one prior outbreak with positive HSV culture showing HSV 1 05/28/15.  She's had not other outbreaks since that time.  GYNECOLOGIC HISTORY: Patient's last menstrual period was 03/22/2017 (approximate). Contraception: Withdrawal  Menopausal hormone therapy: None  Patient Active Problem List   Diagnosis Date Noted  . Dysmenorrhea 04/12/2015    Past Medical History:  Diagnosis Date  . Anxiety   . Asthma   . Depression   . Dysmenorrhea     Past Surgical History:  Procedure Laterality Date  . WISDOM TOOTH EXTRACTION      MEDS: Motrin prn  ALLERGIES: Amoxicillin; Azithromycin; and Levaquin [levofloxacin in d5w]  Family History  Problem Relation Age of Onset  . Thyroid disease Mother   . Asthma Mother   . Asthma Brother   . Ovarian cancer Maternal Grandmother   . Diabetes Maternal Grandmother   . Diabetes Maternal Grandfather   . Diabetes Paternal Grandfather     SH:  Single, smoker  Review of Systems  Gastrointestinal: Positive for abdominal pain.  Genitourinary: Positive for frequency and urgency.       Vaginal itching Vaginal pain   All other systems reviewed and are negative.   PHYSICAL EXAMINATION:    BP 102/60 (BP Location: Right Arm, Patient Position: Sitting, Cuff Size: Large)   Pulse 88   Temp 98.6 F (37 C) (Oral)   Resp 14   Ht 5'  3.5" (1.613 m)   Wt 194 lb (88 kg)   LMP 03/22/2017 (Approximate)   BMI 33.83 kg/m     Physical Exam  Constitutional: She appears well-developed and well-nourished.  GI: Soft. Bowel sounds are normal.  Genitourinary: Vagina normal.    There is lesion on the right labia.  Lymphadenopathy:       Right: No inguinal adenopathy present.       Left: No inguinal adenopathy present.  Skin: Skin is warm and dry.  Psychiatric: She has a normal mood and affect.    Chaperone was present for exam.  Assessment: Recurrent HSV Smoker  Plan: Advised pt I do not feel the hydrocortisone suppositories "caused this" however I understand her not wanting to continue or try them again. Valtrex 500mg  bid x 3 days with RFs if has future outbreaks given.  D/w pt possible suppresive therapy if continues to have outbreaks. Lidocaine 5% topically every 4-6 hours prn pain sent to pharmacy.

## 2017-04-06 ENCOUNTER — Encounter: Payer: Self-pay | Admitting: Obstetrics & Gynecology

## 2017-04-06 DIAGNOSIS — A609 Anogenital herpesviral infection, unspecified: Secondary | ICD-10-CM | POA: Insufficient documentation

## 2018-06-01 ENCOUNTER — Ambulatory Visit: Payer: BLUE CROSS/BLUE SHIELD | Admitting: Obstetrics & Gynecology

## 2019-09-20 ENCOUNTER — Ambulatory Visit: Payer: Self-pay | Admitting: Obstetrics & Gynecology

## 2020-04-06 ENCOUNTER — Encounter: Payer: Self-pay | Admitting: Physician Assistant

## 2020-04-06 ENCOUNTER — Other Ambulatory Visit: Payer: Self-pay

## 2020-04-06 ENCOUNTER — Ambulatory Visit (INDEPENDENT_AMBULATORY_CARE_PROVIDER_SITE_OTHER): Payer: BC Managed Care – PPO | Admitting: Physician Assistant

## 2020-04-06 VITALS — BP 110/72 | HR 75 | Temp 98.0°F | Ht 63.5 in | Wt 207.0 lb

## 2020-04-06 DIAGNOSIS — Z0000 Encounter for general adult medical examination without abnormal findings: Secondary | ICD-10-CM

## 2020-04-06 DIAGNOSIS — E559 Vitamin D deficiency, unspecified: Secondary | ICD-10-CM | POA: Diagnosis not present

## 2020-04-06 DIAGNOSIS — F909 Attention-deficit hyperactivity disorder, unspecified type: Secondary | ICD-10-CM

## 2020-04-06 DIAGNOSIS — Z789 Other specified health status: Secondary | ICD-10-CM | POA: Diagnosis not present

## 2020-04-06 DIAGNOSIS — E669 Obesity, unspecified: Secondary | ICD-10-CM

## 2020-04-06 DIAGNOSIS — F329 Major depressive disorder, single episode, unspecified: Secondary | ICD-10-CM

## 2020-04-06 DIAGNOSIS — F419 Anxiety disorder, unspecified: Secondary | ICD-10-CM | POA: Diagnosis not present

## 2020-04-06 LAB — VITAMIN B12: Vitamin B-12: 393 pg/mL (ref 211–911)

## 2020-04-06 LAB — VITAMIN D 25 HYDROXY (VIT D DEFICIENCY, FRACTURES): VITD: 27.18 ng/mL — ABNORMAL LOW (ref 30.00–100.00)

## 2020-04-06 MED ORDER — AMPHETAMINE-DEXTROAMPHET ER 5 MG PO CP24: 5.0000 mg | ORAL_CAPSULE | Freq: Every day | ORAL | 0 refills | Status: DC

## 2020-04-06 NOTE — Progress Notes (Signed)
Subjective:    Rachel Paul is a 29 y.o. female and is here for a comprehensive physical exam.  HPI  Health Maintenance Due  Topic Date Due  . Hepatitis C Screening  Never done    Acute Concerns: None  Chronic Issues: Depression/Anxiety -- was on Lexapro 10 mg daily but didn't like the way she felt on it. Was doing talk therapy in the past and feels like she could benefit from resuming this. Denies SI/HI. ADHD -- dx in elementary school, medications in the past include Ritalin (caused possible tic?), Concerta (caused restless leg syndrome). She is interested in possible restarting medication. Vitamin D deficiency -- would like this lab updated  Health Maintenance: Immunizations -- COVID vaccinated Colonoscopy -- n/a Mammogram -- n/a PAP -- UTD, normal Bone Density -- n/a Diet -- vegan  Caffeine intake -- 2 cups of coffee per week Sleep habits -- no concerns Exercise -- gets about 15-30 min exercise Current Weight -- Weight: 207 lb (93.9 kg)  Weight History: Wt Readings from Last 10 Encounters:  04/06/20 207 lb (93.9 kg)  04/03/17 194 lb (88 kg)  03/12/17 193 lb (87.5 kg)  02/21/17 185 lb (83.9 kg)  06/06/15 206 lb 1.6 oz (93.5 kg)  05/28/15 204 lb (92.5 kg)  04/12/15 208 lb (94.3 kg)   Body mass index is 36.09 kg/m. Mood -- overall stable No LMP recorded. Period characteristics -- irregular, lots of cramps Birth control -- all throughout college -- was on OCPs  Depression screen PHQ 2/9 04/06/2020  Decreased Interest 1  Down, Depressed, Hopeless 0  PHQ - 2 Score 1  Altered sleeping 1  Tired, decreased energy 1  Change in appetite 1  Feeling bad or failure about yourself  0  Trouble concentrating 1  Moving slowly or fidgety/restless 0  Suicidal thoughts 0  PHQ-9 Score 5     Other providers/specialists: Patient Care Team: Rachel Paul, [place] as PCP - General (Physician Assistant)   PMHx, SurgHx, SocialHx, Medications, and Allergies were  reviewed in the Visit Navigator and updated as appropriate.   Past Medical History:  Diagnosis Date  . Anxiety   . Asthma   . Depression   . Dysmenorrhea   . Pyelonephritis      Past Surgical History:  Procedure Laterality Date  . WISDOM TOOTH EXTRACTION       Family History  Problem Relation Age of Onset  . Thyroid disease Mother   . Asthma Mother   . Sleep apnea Mother   . Asthma Brother   . Ovarian cancer Maternal Grandmother   . Diabetes Maternal Grandmother   . Cancer Maternal Grandmother   . Diabetes Maternal Grandfather   . Diabetes Paternal Grandfather   . Prostate cancer Paternal Grandfather   . Skin cancer Father   . Sleep apnea Brother   . Colon cancer Neg Hx   . Breast cancer Neg Hx     Social History   Tobacco Use  . Smoking status: Current Some Day Smoker  . Smokeless tobacco: Never Used  . Tobacco comment: patient says she smokes 1 per day - smokes more if she's drinkning  Vaping Use  . Vaping Use: Never used  Substance Use Topics  . Alcohol use: Yes    Alcohol/week: 1.0 standard drink    Types: 1 Cans of beer per week  . Drug use: Yes    Types: Marijuana    Review of Systems:   Review of Systems  Constitutional:  Negative for chills, fever, malaise/fatigue and weight loss.  HENT: Negative for hearing loss, sinus pain and sore throat.   Respiratory: Negative for cough and hemoptysis.   Cardiovascular: Negative for chest pain, palpitations, leg swelling and PND.  Gastrointestinal: Negative for abdominal pain, constipation, diarrhea, heartburn, nausea and vomiting.  Genitourinary: Negative for dysuria, frequency and urgency.  Musculoskeletal: Negative for back pain, myalgias and neck pain.  Skin: Negative for itching and rash.  Neurological: Negative for dizziness, tingling, seizures and headaches.  Endo/Heme/Allergies: Negative for polydipsia.  Psychiatric/Behavioral: Negative for depression. The patient is not nervous/anxious.      Objective:   BP 110/72   Pulse 75   Temp 98 F (36.7 C)   Ht 5' 3.5" (1.613 m)   Wt 207 lb (93.9 kg)   SpO2 97%   BMI 36.09 kg/m   General Appearance:    Alert, cooperative, no distress, appears stated age  Head:    Normocephalic, without obvious abnormality, atraumatic  Eyes:    PERRL, conjunctiva/corneas clear, EOM's intact, fundi    benign, both eyes  Ears:    Normal TM's and external ear canals, both ears  Nose:   Nares normal, septum midline, mucosa normal, no drainage    or sinus tenderness  Throat:   Lips, mucosa, and tongue normal; teeth and gums normal  Neck:   Supple, symmetrical, trachea midline, no adenopathy;    thyroid:  no enlargement/tenderness/nodules; no carotid   bruit or JVD  Back:     Symmetric, no curvature, ROM normal, no CVA tenderness  Lungs:     Clear to auscultation bilaterally, respirations unlabored  Chest Wall:    No tenderness or deformity   Heart:    Regular rate and rhythm, S1 and S2 normal, no murmur, rub   or gallop  Breast Exam:    Deferred  Abdomen:     Soft, non-tender, bowel sounds active all four quadrants,    no masses, no organomegaly  Genitalia:    Deferred  Rectal:    Deferred  Extremities:   Extremities normal, atraumatic, no cyanosis or edema  Pulses:   2+ and symmetric all extremities  Skin:   Skin color, texture, turgor normal, no rashes or lesions  Lymph nodes:   Cervical, supraclavicular, and axillary nodes normal  Neurologic:   CNII-XII intact, normal strength, sensation and reflexes    throughout    Assessment/Plan:   Rachel Paul was seen today for annual exam.  Diagnoses and all orders for this visit:  Routine physical examination Today patient counseled on age appropriate routine health concerns for screening and prevention, each reviewed and up to date or declined. Immunizations reviewed and up to date or declined. Labs ordered and reviewed. Risk factors for depression reviewed and negative. Hearing function and  visual acuity are intact. ADLs screened and addressed as needed. Functional ability and level of safety reviewed and appropriate. Education, counseling and referrals performed based on assessed risks today. Patient provided with a copy of personalized plan for preventive services.  Vitamin D deficiency Update labs and provide further recommendations as indicated. -     VITAMIN D 25 Hydroxy (Vit-D Deficiency, Fractures)  Vegan Update Vitamin B12 level today, she does supplement this regularly, will provide recommendations as indicated. -     Vitamin B12  Anxiety and depression Overall well controlled, denies SI/HI. Would like therapy refill. -     Ambulatory referral to Psychology  Attention deficit hyperactivity disorder (ADHD), unspecified ADHD type Uncontrolled. Will trial  Adderall XR 5 mg daily. Follow-up in 1 month. Discussed that this medication is unsafe and should be stopped if she were to become pregnant. Patient verbalized understanding to plan.  Obesity, unspecified classification, unspecified obesity type, unspecified whether serious comorbidity present She is hopeful that getting her ADHD better controlled will provide motivation to help get her lifestyle under better control and promote better exercise.  Other orders -     amphetamine-dextroamphetamine (ADDERALL XR) 5 MG 24 hr capsule; Take 1 capsule (5 mg total) by mouth daily.    Well Adult Exam: Labs ordered: Yes. Patient counseling was done. See below for items discussed. Discussed the patient's BMI. The BMI is not in the acceptable range; BMI management plan is completed Follow up in 1 month.  Patient Counseling:   [x]     Nutrition: Stressed importance of moderation in sodium/caffeine intake, saturated fat and cholesterol, caloric balance, sufficient intake of fresh fruits, vegetables, fiber, calcium, iron, and 1 mg of folate supplement per day (for females capable of pregnancy).   [x]      Stressed the importance  of regular exercise.    [x]     Substance Abuse: Discussed cessation/primary prevention of tobacco, alcohol, or other drug use; driving or other dangerous activities under the influence; availability of treatment for abuse.    [x]      Injury prevention: Discussed safety belts, safety helmets, smoke detector, smoking near bedding or upholstery.    [x]      Sexuality: Discussed sexually transmitted diseases, partner selection, use of condoms, avoidance of unintended pregnancy  and contraceptive alternatives.    [x]     Dental health: Discussed importance of regular tooth brushing, flossing, and dental visits.   [x]      Health maintenance and immunizations reviewed. Please refer to Health maintenance section.    Rachel Coke, PA-C [facility]

## 2020-04-06 NOTE — Patient Instructions (Signed)
It was great to see you!  Please go to the lab for blood work.   Our office will call you with your results unless you have chosen to receive results via MyChart.  If your blood work is normal we will follow-up each year for physicals and as scheduled for chronic medical problems.  If anything is abnormal we will treat accordingly and get you in for a follow-up.  Take care,  Rachel Paul    Health Maintenance, Female Adopting a healthy lifestyle and getting preventive care are important in promoting health and wellness. Ask your health care provider about:  The right schedule for you to have regular tests and exams.  Things you can do on your own to prevent diseases and keep yourself healthy. What should I know about diet, weight, and exercise? Eat a healthy diet   Eat a diet that includes plenty of vegetables, fruits, low-fat dairy products, and lean protein.  Do not eat a lot of foods that are high in solid fats, added sugars, or sodium. Maintain a healthy weight Body mass index (BMI) is used to identify weight problems. It estimates body fat based on height and weight. Your health care provider can help determine your BMI and help you achieve or maintain a healthy weight. Get regular exercise Get regular exercise. This is one of the most important things you can do for your health. Most adults should:  Exercise for at least 150 minutes each week. The exercise should increase your heart rate and make you sweat (moderate-intensity exercise).  Do strengthening exercises at least twice a week. This is in addition to the moderate-intensity exercise.  Spend less time sitting. Even light physical activity can be beneficial. Watch cholesterol and blood lipids Have your blood tested for lipids and cholesterol at 31 years of age, then have this test every 5 years. Have your cholesterol levels checked more often if:  Your lipid or cholesterol levels are high.  You are older than 31  years of age.  You are at high risk for heart disease. What should I know about cancer screening? Depending on your health history and family history, you may need to have cancer screening at various ages. This may include screening for:  Breast cancer.  Cervical cancer.  Colorectal cancer.  Skin cancer.  Lung cancer. What should I know about heart disease, diabetes, and high blood pressure? Blood pressure and heart disease  High blood pressure causes heart disease and increases the risk of stroke. This is more likely to develop in people who have high blood pressure readings, are of African descent, or are overweight.  Have your blood pressure checked: ? Every 3-5 years if you are 18-39 years of age. ? Every year if you are 40 years old or older. Diabetes Have regular diabetes screenings. This checks your fasting blood sugar level. Have the screening done:  Once every three years after age 40 if you are at a normal weight and have a low risk for diabetes.  More often and at a younger age if you are overweight or have a high risk for diabetes. What should I know about preventing infection? Hepatitis B If you have a higher risk for hepatitis B, you should be screened for this virus. Talk with your health care provider to find out if you are at risk for hepatitis B infection. Hepatitis C Testing is recommended for:  Everyone born from 1945 through 1965.  Anyone with known risk factors for hepatitis C. Sexually   transmitted infections (STIs)  Get screened for STIs, including gonorrhea and chlamydia, if: ? You are sexually active and are younger than 31 years of age. ? You are older than 31 years of age and your health care provider tells you that you are at risk for this type of infection. ? Your sexual activity has changed since you were last screened, and you are at increased risk for chlamydia or gonorrhea. Ask your health care provider if you are at risk.  Ask your health  care provider about whether you are at high risk for HIV. Your health care provider may recommend a prescription medicine to help prevent HIV infection. If you choose to take medicine to prevent HIV, you should first get tested for HIV. You should then be tested every 3 months for as long as you are taking the medicine. Pregnancy  If you are about to stop having your period (premenopausal) and you may become pregnant, seek counseling before you get pregnant.  Take 400 to 800 micrograms (mcg) of folic acid every day if you become pregnant.  Ask for birth control (contraception) if you want to prevent pregnancy. Osteoporosis and menopause Osteoporosis is a disease in which the bones lose minerals and strength with aging. This can result in bone fractures. If you are 65 years old or older, or if you are at risk for osteoporosis and fractures, ask your health care provider if you should:  Be screened for bone loss.  Take a calcium or vitamin D supplement to lower your risk of fractures.  Be given hormone replacement therapy (HRT) to treat symptoms of menopause. Follow these instructions at home: Lifestyle  Do not use any products that contain nicotine or tobacco, such as cigarettes, e-cigarettes, and chewing tobacco. If you need help quitting, ask your health care provider.  Do not use street drugs.  Do not share needles.  Ask your health care provider for help if you need support or information about quitting drugs. Alcohol use  Do not drink alcohol if: ? Your health care provider tells you not to drink. ? You are pregnant, may be pregnant, or are planning to become pregnant.  If you drink alcohol: ? Limit how much you use to 0-1 drink a day. ? Limit intake if you are breastfeeding.  Be aware of how much alcohol is in your drink. In the [place] one drink equals one 12 oz bottle of beer (355 mL), one 5 oz glass of wine (148 mL), or one 1 oz glass of hard liquor (44 mL). General  instructions  Schedule regular health, dental, and eye exams.  Stay current with your vaccines.  Tell your health care provider if: ? You often feel depressed. ? You have ever been abused or do not feel safe at home. Summary  Adopting a healthy lifestyle and getting preventive care are important in promoting health and wellness.  Follow your health care provider's instructions about healthy diet, exercising, and getting tested or screened for diseases.  Follow your health care provider's instructions on monitoring your cholesterol and blood pressure. This information is not intended to replace advice given to you by your health care provider. Make sure you discuss any questions you have with your health care provider. Document Revised: 10/06/2018 Document Reviewed: 10/06/2018 Elsevier Patient Education  2020 [employer]  

## 2020-04-11 ENCOUNTER — Encounter: Payer: Self-pay | Admitting: Physician Assistant

## 2020-04-11 NOTE — Telephone Encounter (Signed)
Received fax from pharmacy need PA for Adderall XR 5 mg. PA done through Covermymeds Key: [id number] - PA Case ID: [id number] - Rx #: [id number] Approved today Your PA request has been approved. Additional information will be provided in the approval communication  Called [facility] spoke to [place] told her PA has been done and approved for Adderall XR 5 mg. Dolly verbalized understanding.

## 2020-04-11 NOTE — Telephone Encounter (Signed)
Pt notified PA was done for Adderall XR 5 mg and was approved, pharmacy notified and they are getting Rx ready. PT verbalized understanding.

## 2020-05-09 ENCOUNTER — Ambulatory Visit (INDEPENDENT_AMBULATORY_CARE_PROVIDER_SITE_OTHER): Payer: BC Managed Care – PPO | Admitting: Psychology

## 2020-05-09 DIAGNOSIS — F411 Generalized anxiety disorder: Secondary | ICD-10-CM

## 2020-05-09 DIAGNOSIS — F331 Major depressive disorder, recurrent, moderate: Secondary | ICD-10-CM

## 2020-05-11 ENCOUNTER — Other Ambulatory Visit: Payer: Self-pay | Admitting: Physician Assistant

## 2020-05-11 MED ORDER — AMPHETAMINE-DEXTROAMPHET ER 5 MG PO CP24: 5.0000 mg | ORAL_CAPSULE | Freq: Every day | ORAL | 0 refills | Status: DC

## 2020-05-11 NOTE — Telephone Encounter (Signed)
Pt requesting refill, last OV 03/2020

## 2020-05-29 ENCOUNTER — Ambulatory Visit (INDEPENDENT_AMBULATORY_CARE_PROVIDER_SITE_OTHER): Payer: BC Managed Care – PPO | Admitting: Psychology

## 2020-05-29 DIAGNOSIS — F411 Generalized anxiety disorder: Secondary | ICD-10-CM | POA: Diagnosis not present

## 2020-05-29 DIAGNOSIS — F331 Major depressive disorder, recurrent, moderate: Secondary | ICD-10-CM

## 2020-06-18 ENCOUNTER — Other Ambulatory Visit: Payer: Self-pay | Admitting: Physician Assistant

## 2020-06-18 MED ORDER — AMPHETAMINE-DEXTROAMPHET ER 5 MG PO CP24: 5.0000 mg | ORAL_CAPSULE | Freq: Every day | ORAL | 0 refills | Status: DC

## 2020-06-18 NOTE — Telephone Encounter (Signed)
LAST APPOINTMENT DATE: 6/11//2021   NEXT APPOINTMENT DATE: Visit date not found    LAST REFILL: 05/11/2020  QTY: 30

## 2020-06-19 ENCOUNTER — Ambulatory Visit: Payer: Self-pay | Admitting: Psychology

## 2020-06-29 ENCOUNTER — Other Ambulatory Visit: Payer: Self-pay

## 2020-07-23 ENCOUNTER — Other Ambulatory Visit: Payer: Self-pay | Admitting: Physician Assistant

## 2020-07-23 MED ORDER — AMPHETAMINE-DEXTROAMPHET ER 5 MG PO CP24: 5.0000 mg | ORAL_CAPSULE | Freq: Every day | ORAL | 0 refills | Status: DC

## 2020-07-23 NOTE — Telephone Encounter (Signed)
Left message on voicemail to call office.  

## 2020-07-23 NOTE — Telephone Encounter (Signed)
Last refill to be provided until she follows-up with [place] in office.

## 2020-07-23 NOTE — Telephone Encounter (Signed)
Pt requesting refill on Adderall. Last OV 03/2020.

## 2020-08-13 ENCOUNTER — Ambulatory Visit: Payer: BC Managed Care – PPO | Admitting: Physician Assistant

## 2020-08-13 ENCOUNTER — Encounter: Payer: Self-pay | Admitting: Physician Assistant

## 2020-08-13 ENCOUNTER — Other Ambulatory Visit: Payer: Self-pay

## 2020-08-13 VITALS — BP 110/68 | HR 76 | Temp 97.6°F | Ht 63.5 in | Wt 202.0 lb

## 2020-08-13 DIAGNOSIS — D229 Melanocytic nevi, unspecified: Secondary | ICD-10-CM | POA: Diagnosis not present

## 2020-08-13 DIAGNOSIS — M2550 Pain in unspecified joint: Secondary | ICD-10-CM

## 2020-08-13 DIAGNOSIS — F419 Anxiety disorder, unspecified: Secondary | ICD-10-CM

## 2020-08-13 DIAGNOSIS — R21 Rash and other nonspecific skin eruption: Secondary | ICD-10-CM | POA: Diagnosis not present

## 2020-08-13 DIAGNOSIS — F32A Depression, unspecified: Secondary | ICD-10-CM

## 2020-08-13 DIAGNOSIS — R5383 Other fatigue: Secondary | ICD-10-CM

## 2020-08-13 DIAGNOSIS — F909 Attention-deficit hyperactivity disorder, unspecified type: Secondary | ICD-10-CM

## 2020-08-13 MED ORDER — VALACYCLOVIR HCL 500 MG PO TABS: ORAL_TABLET | ORAL | 2 refills | Status: DC

## 2020-08-13 MED ORDER — AMPHETAMINE-DEXTROAMPHET ER 10 MG PO CP24: 10.0000 mg | ORAL_CAPSULE | ORAL | 0 refills | Status: DC

## 2020-08-13 MED ORDER — LORAZEPAM 0.5 MG PO TABS: 0.5000 mg | ORAL_TABLET | Freq: Every day | ORAL | 0 refills | Status: DC | PRN

## 2020-08-13 NOTE — Progress Notes (Signed)
Rachel Paul is a 29 y.o. female is here for follow up.  I acted as a Education administrator for [employer], PA-C Anselmo Pickler, LPN   History of Present Illness:   Chief Complaint  Patient presents with  . ADHD  . Anxiety  . Depression    HPI   Controlled substance: Adderall XR 5 mg capsule Pharmacy: [facility] [place], [place] [id number] Status: Last Rx filled 07/23/20 Adderall XR 5 mg # 30, 0 refill. New Findings: N/A  ADHD Pt following up today, currently taking Adderall XR 5 mg daily. Pt is still having looping thoughts and binge eating. Pt thinks she needs an increase. Pt did try to take 2 capsules one day and it helped with binge eating. She is hoping that she can increase to 10 mg XR daily to help with anxiety and binge eating.  Anxiety/Depression Pt is currently not taking any medications. She is seeing a therapist which is helping somewhat. Pt is still very anxious. Denies SI/HI. Was on SSRI in college but felt like it made her a zombie, she is very hesitant to restart medications. Was given xanax prn to use for anxiety. States that she rarely used it but it was very helpful.  GAD 7 : Generalized Anxiety Score 08/13/2020  Nervous, Anxious, on Edge 1  Control/stop worrying 2  Worry too much - different things 3  Trouble relaxing 3  Restless 3  Easily annoyed or irritable 2  Afraid - awful might happen 0  Total GAD 7 Score 14  Anxiety Difficulty Somewhat difficult   Mole Has a mole on her R upper back. The area is darkened at times. Has been there for several years. She states that it recently started peeling, was the circumference of a "golf ball". Area was painful when it was peeling. Area has returned to its normal size/shape since it was peeling.     Rash; Joint pain; Fatigue States that she gets a facial rash sometimes but has issues with rosacea so she is not sure if this is related. Has a great aunt? With lupus, so her mom has her concerned she may have lupus.  Rash does not worsen with sun exposure. Has joint pain in her hands and knees. Has fatigue but is not sure if it is excessive. Denies rashes at joints.   Health Maintenance Due  Topic Date Due  . Hepatitis C Screening  Never done    Past Medical History:  Diagnosis Date  . Anxiety   . Asthma   . Depression   . Dysmenorrhea   . Pyelonephritis      Social History   Tobacco Use  . Smoking status: Current Some Day Smoker  . Smokeless tobacco: Never Used  . Tobacco comment: patient says she smokes 1 per day - smokes more if she's drinkning  Vaping Use  . Vaping Use: Never used  Substance Use Topics  . Alcohol use: Yes    Alcohol/week: 1.0 standard drink    Types: 1 Cans of beer per week  . Drug use: Yes    Types: Marijuana    Past Surgical History:  Procedure Laterality Date  . WISDOM TOOTH EXTRACTION      Family History  Problem Relation Age of Onset  . Thyroid disease Mother   . Asthma Mother   . Sleep apnea Mother   . Asthma Brother   . Ovarian cancer Maternal Grandmother   . Diabetes Maternal Grandmother   . Cancer Maternal  Grandmother   . Diabetes Maternal Grandfather   . Diabetes Paternal Grandfather   . Prostate cancer Paternal Grandfather   . Skin cancer Father   . Sleep apnea Brother   . Colon cancer Neg Hx   . Breast cancer Neg Hx     PMHx, SurgHx, SocialHx, FamHx, Medications, and Allergies were reviewed in the Visit Navigator and updated as appropriate.   Patient Active Problem List   Diagnosis Date Noted  . Vegan 04/06/2020  . Anxiety and depression 04/06/2020  . HSV (herpes simplex virus) anogenital infection 04/06/2017  . Dysmenorrhea 04/12/2015    Social History   Tobacco Use  . Smoking status: Current Some Day Smoker  . Smokeless tobacco: Never Used  . Tobacco comment: patient says she smokes 1 per day - smokes more if she's drinkning  Vaping Use  . Vaping Use: Never used  Substance Use Topics  . Alcohol use: Yes    Alcohol/week:  1.0 standard drink    Types: 1 Cans of beer per week  . Drug use: Yes    Types: Marijuana    Current Medications and Allergies:    Current Outpatient Medications:  .  ibuprofen (ADVIL,MOTRIN) 600 MG tablet, Take 1 tablet (600 mg total) by mouth every 8 (eight) hours as needed for moderate pain., Disp: 20 tablet, Rfl: 0 .  valACYclovir (VALTREX) 500 MG tablet, Take bid x 3 days with symptoms, Disp: 30 tablet, Rfl: 2 .  amphetamine-dextroamphetamine (ADDERALL XR) 10 MG 24 hr capsule, Take 1 capsule (10 mg total) by mouth every morning., Disp: 30 capsule, Rfl: 0 .  [START ON 09/12/2020] amphetamine-dextroamphetamine (ADDERALL XR) 10 MG 24 hr capsule, Take 1 capsule (10 mg total) by mouth every morning., Disp: 30 capsule, Rfl: 0 .  [START ON 10/12/2020] amphetamine-dextroamphetamine (ADDERALL XR) 10 MG 24 hr capsule, Take 1 capsule (10 mg total) by mouth every morning., Disp: 30 capsule, Rfl: 0 .  LORazepam (ATIVAN) 0.5 MG tablet, Take 1 tablet (0.5 mg total) by mouth daily as needed for anxiety., Disp: 30 tablet, Rfl: 0   Allergies  Allergen Reactions  . Amoxicillin Hives  . Azithromycin Itching and Swelling  . Levaquin [Levofloxacin In D5w] Hives    Review of Systems   ROS Negative unless otherwise specified per HPI.  Vitals:   Vitals:   08/13/20 0841  BP: 110/68  Pulse: 76  Temp: 97.6 F (36.4 C)  TempSrc: Temporal  Weight: 202 lb (91.6 kg)  Height: 5' 3.5" (1.613 m)     Body mass index is 35.22 kg/m.   Physical Exam:    Physical Exam Vitals and nursing note reviewed.  Constitutional:      General: She is not in acute distress.    Appearance: She is well-developed. She is not ill-appearing or toxic-appearing.  Cardiovascular:     Rate and Rhythm: Normal rate and regular rhythm.     Pulses: Normal pulses.     Heart sounds: Normal heart sounds, S1 normal and S2 normal.     Comments: No LE edema Pulmonary:     Effort: Pulmonary effort is normal.     Breath  sounds: Normal breath sounds.  Skin:    General: Skin is warm and dry.     Comments: Raised brown papule to R upper back approximately 2 mm in diameter  Neurological:     Mental Status: She is alert.     GCS: GCS eye subscore is 4. GCS verbal subscore is 5. GCS motor subscore  is 6.  Psychiatric:        Speech: Speech normal.        Behavior: Behavior normal. Behavior is cooperative.      Assessment and Plan:    Gaynelle was seen today for adhd, anxiety and depression.  Diagnoses and all orders for this visit:  Atypical mole Offered biopsy today but she declined; would like dermatology referral. Referral placed. -     Ambulatory referral to Dermatology  Rash; Arthralgia, unspecified joint; Fatigue, unspecified type Unclear etiology. Symptoms are vague, however will update labs as well as add on autoimmune panel for further evaluation. Low threshold to send to rheumatology. -     CBC with Differential/Platelet; Future -     Comprehensive metabolic panel; Future -     Sedimentation rate; Future -     C-reactive protein; Future -     ANA; Future -     Rheumatoid factor; Future -     Rheumatoid factor -     ANA -     C-reactive protein -     Sedimentation rate -     Comprehensive metabolic panel -     CBC with Differential/Platelet  Anxiety and depression Will trial ativan instead of xanax to see if this still provides her with relief. She is open to trialing an SNRI but would like to wait for now. Follow-up in 3 months, sooner if concerns.  Attention deficit hyperactivity disorder (ADHD), unspecified ADHD type No red flags on exam or in database. Refill Adderall XR to 10 mg daily. She is aware that this is not safe to continue if she were to become pregnant.  Other orders -     LORazepam (ATIVAN) 0.5 MG tablet; Take 1 tablet (0.5 mg total) by mouth daily as needed for anxiety. -     amphetamine-dextroamphetamine (ADDERALL XR) 10 MG 24 hr capsule; Take 1 capsule (10 mg  total) by mouth every morning. -     amphetamine-dextroamphetamine (ADDERALL XR) 10 MG 24 hr capsule; Take 1 capsule (10 mg total) by mouth every morning. -     amphetamine-dextroamphetamine (ADDERALL XR) 10 MG 24 hr capsule; Take 1 capsule (10 mg total) by mouth every morning. -     valACYclovir (VALTREX) 500 MG tablet; Take bid x 3 days with symptoms  CMA or LPN served as scribe during this visit. History, Physical, and Plan performed by medical provider. The above documentation has been reviewed and is accurate and complete.   [name], PA-C [place] [place] 08/13/2020  Follow-up: No follow-ups on file.

## 2020-08-13 NOTE — Patient Instructions (Signed)
It was great to see you!  Refill of Adderall 10 mg XR daily sent in. Ativan as needed sent in.  We will update your labs today and also check some autoimmune labs. I will be in touch with these results.  Take care,  [name] PA-C

## 2020-08-14 LAB — CBC WITH DIFFERENTIAL/PLATELET
Eosinophils Absolute: 102 cells/uL (ref 15–500)
HCT: 37.7 % (ref 35.0–45.0)
Neutro Abs: 4176 cells/uL (ref 1500–7800)
Neutrophils Relative %: 57.2 %
RDW: 11.9 % (ref 11.0–15.0)
Total Lymphocyte: 35 %

## 2020-08-14 LAB — COMPREHENSIVE METABOLIC PANEL: Globulin: 2.5 g/dL (calc) (ref 1.9–3.7)

## 2020-08-15 LAB — CBC WITH DIFFERENTIAL/PLATELET
Absolute Monocytes: 416 cells/uL (ref 200–950)
Basophils Absolute: 51 cells/uL (ref 0–200)
Basophils Relative: 0.7 %
Eosinophils Relative: 1.4 %
Hemoglobin: 12.9 g/dL (ref 11.7–15.5)
Lymphs Abs: 2555 cells/uL (ref 850–3900)
MCH: 32.3 pg (ref 27.0–33.0)
MCHC: 34.2 g/dL (ref 32.0–36.0)
MCV: 94.3 fL (ref 80.0–100.0)
MPV: 10.9 fL (ref 7.5–12.5)
Monocytes Relative: 5.7 %
Platelets: 264 10*3/uL (ref 140–400)
RBC: 4 10*6/uL (ref 3.80–5.10)
WBC: 7.3 10*3/uL (ref 3.8–10.8)

## 2020-08-15 LAB — RHEUMATOID FACTOR: Rhuematoid fact SerPl-aCnc: <14 IU/mL (ref <14)

## 2020-08-15 LAB — COMPREHENSIVE METABOLIC PANEL
AG Ratio: 1.8 (calc) (ref 1.0–2.5)
ALT: 15 U/L (ref 6–29)
AST: 32 U/L — ABNORMAL HIGH (ref 10–30)
Albumin: 4.5 g/dL (ref 3.6–5.1)
Alkaline phosphatase (APISO): 50 U/L (ref 31–125)
BUN: 10 mg/dL (ref 7–25)
CO2: 26 mmol/L (ref 20–32)
Calcium: 9.7 mg/dL (ref 8.6–10.2)
Chloride: 104 mmol/L (ref 98–110)
Creat: 0.67 mg/dL (ref 0.50–1.10)
Glucose, Bld: 89 mg/dL (ref 65–99)
Potassium: 4.3 mmol/L (ref 3.5–5.3)
Sodium: 139 mmol/L (ref 135–146)
Total Bilirubin: 0.4 mg/dL (ref 0.2–1.2)
Total Protein: 7 g/dL (ref 6.1–8.1)

## 2020-08-15 LAB — C-REACTIVE PROTEIN: CRP: 2.4 mg/L (ref <8.0)

## 2020-08-15 LAB — ANTI-NUCLEAR AB-TITER (ANA TITER): ANA Titer 1: 1:40 {titer} — ABNORMAL HIGH

## 2020-08-15 LAB — ANA: Anti Nuclear Antibody (ANA): POSITIVE — AB

## 2020-08-15 LAB — SEDIMENTATION RATE: Sed Rate: 29 mm/h — ABNORMAL HIGH (ref 0–20)

## 2020-08-16 ENCOUNTER — Encounter: Payer: Self-pay | Admitting: Physician Assistant

## 2020-08-16 DIAGNOSIS — R768 Other specified abnormal immunological findings in serum: Secondary | ICD-10-CM

## 2020-08-21 ENCOUNTER — Other Ambulatory Visit: Payer: Self-pay | Admitting: Physician Assistant

## 2020-09-28 ENCOUNTER — Ambulatory Visit: Payer: BC Managed Care – PPO | Admitting: Internal Medicine

## 2020-10-04 ENCOUNTER — Encounter: Payer: Self-pay | Admitting: Physician Assistant

## 2020-10-15 NOTE — Progress Notes (Signed)
Office Visit Note  Patient: Rachel Paul             Date of Birth: 21-Sep-1991           MRN: [id number]             PCP: [name], PA Referring: [name], PA Visit Date: 10/16/2020 Occupation: Donell Sievert  Subjective:  New Patient (Initial Visit) (Abnormal labs)   History of Present Illness: Rachel Paul is a 29 y.o. female here for evaluation of positive ANA with fatigue, rashes, and joint pains. She has been noticing symptoms for around 4-5 years with facial rashes that tend to worsen with heat exposure both sun and situations like hot showers, smoking, or alcohol use. This is limited to the face without other areas. She has also noticed episodes of urticaria on much of the skin about 2-3 times per year. These resolved without specific treatment and no residual skin changes. She has joint pain and stiffness worst in her hands and some in lower extremities but does not notice much swelling or erythema with these. She also has numbness and frequent discoloration in the toes especially 3rd-4th toes on her feet. She has previously suffered fairly frequent UTIs and several paronychia sounding episodes and one pyelonephritis but no chronic kidney disease. She has a family history of SLE in a maternal grandmother with hematological complications.   Labs reviewed 07/2020 ANA 1:40 speckled ESR 29 RF negative  Activities of Daily Living:  Patient reports morning stiffness for 24 hours.   Patient Reports nocturnal pain.  Difficulty dressing/grooming: Denies Difficulty climbing stairs: Denies Difficulty getting out of chair: Denies Difficulty using hands for taps, buttons, cutlery, and/or writing: Denies  Review of Systems  Constitutional: Positive for fatigue.  HENT: Positive for mouth dryness.   Eyes: Negative for dryness.  Respiratory: Negative for shortness of breath.   Cardiovascular: Positive for swelling in legs/feet.  Gastrointestinal: Positive for  constipation.  Endocrine: Positive for cold intolerance and excessive thirst.  Genitourinary: Negative for difficulty urinating.  Musculoskeletal: Positive for arthralgias, joint pain, joint swelling, morning stiffness and muscle tenderness.  Skin: Positive for rash.  Allergic/Immunologic: Positive for susceptible to infections.  Neurological: Positive for numbness.  Hematological: Negative for bruising/bleeding tendency.  Psychiatric/Behavioral: Positive for sleep disturbance.    PMFS History:  Patient Active Problem List   Diagnosis Date Noted   Positive ANA (antinuclear antibody) 10/16/2020   Facial rash 10/16/2020   Arthralgia 10/16/2020   Vegan 04/06/2020   Anxiety and depression 04/06/2020   HSV (herpes simplex virus) anogenital infection 04/06/2017   Dysmenorrhea 04/12/2015    Past Medical History:  Diagnosis Date   Anxiety    Asthma    Depression    Dysmenorrhea    Pyelonephritis     Family History  Problem Relation Age of Onset   Thyroid disease Mother    Asthma Mother    Sleep apnea Mother    Asthma Brother    Ovarian cancer Maternal Grandmother    Diabetes Maternal Grandmother    Cancer Maternal Grandmother    Diabetes Maternal Grandfather    Diabetes Paternal Grandfather    Prostate cancer Paternal Grandfather    Skin cancer Father    Sleep apnea Brother    Colon cancer Neg Hx    Breast cancer Neg Hx    Past Surgical History:  Procedure Laterality Date   WISDOM TOOTH EXTRACTION     Social History   Social History Narrative  Self-Employed    Used to work for [employer]   Married   No children   Immunization History  Administered Date(s) Administered   [employer] Vaccination 01/14/2020, 02/06/2020, 10/04/2020   Tdap 04/21/2012     Objective: Vital Signs: BP 115/69 (BP Location: Left Arm, Patient Position: Sitting, Cuff Size: Normal)    Pulse 78    Resp 14    Ht 5' 3.25" (1.607 m)    Wt 196 lb (88.9  kg)    BMI 34.45 kg/m    Physical Exam Constitutional:      Appearance: She is obese.  HENT:     Right Ear: External ear normal.     Left Ear: External ear normal.     Mouth/Throat:     Mouth: Mucous membranes are moist.     Pharynx: Oropharynx is clear.  Eyes:     Conjunctiva/sclera: Conjunctivae normal.  Neck:     Comments: Right anterior cervical tenderness to palpation Cardiovascular:     Rate and Rhythm: Normal rate and regular rhythm.  Pulmonary:     Effort: Pulmonary effort is normal.     Breath sounds: Normal breath sounds.  Musculoskeletal:     Cervical back: Neck supple.  Skin:    General: Skin is warm and dry.     Findings: Rash present.     Comments: Erythematous rash of central face including nasolabial folds, no scaling crusting or lesions Nailfold capillaries appear normal  Neurological:     General: No focal deficit present.     Mental Status: She is alert.     Comments: Bilateral knee jerk and ankle jerk reflexes normal  Psychiatric:        Mood and Affect: Mood normal.      Musculoskeletal Exam:  Neck full range of motion no tenderness Shoulder, elbow, wrist, fingers full range of motion no tenderness or swelling Knees, ankles, MTPs full range of motion no tenderness or swelling   Investigation: No additional findings.  Imaging: No results found.  Recent Labs: Lab Results  Component Value Date   WBC 7.3 08/13/2020   HGB 12.9 08/13/2020   PLT 264 08/13/2020   NA 139 08/13/2020   K 4.3 08/13/2020   CL 104 08/13/2020   CO2 26 08/13/2020   GLUCOSE 89 08/13/2020   BUN 10 08/13/2020   CREATININE 0.67 08/13/2020   BILITOT 0.4 08/13/2020   AST 32 (H) 08/13/2020   ALT 15 08/13/2020   PROT 7.0 08/13/2020   CALCIUM 9.7 08/13/2020   GFRAA >60 02/21/2017    Speciality Comments: No specialty comments available.  Procedures:  No procedures performed Allergies: Amoxicillin, Azithromycin, Levaquin [levofloxacin in d5w], and Levofloxacin    Assessment / Plan:     Visit Diagnoses: Positive ANA (antinuclear antibody)  Arthralgia of both hands- Plan: Anti-Smith antibody, Sjogrens syndrome-A extractable nuclear antibody, Sjogrens syndrome-B extractable nuclear antibody, Anti-DNA antibody, double-stranded, C3 and C4  No inflammatory joint changes or skin changes noted on exam today. Low titer ANA also does not meet classification criteria of SLE. With family history and concerns will check specific ENAs SSA, SSB, dsDNA, Smith, and check complement levels. If negative would not have concern for lupus to need any additional testing or follow up.  Facial rash  Facial rash appearance and sensitivity to heat and other stimuli sounds more consistent distribution with rosacea than SLE related malar rash    Orders: Orders Placed This Encounter  Procedures   Anti-Smith antibody   Sjogrens syndrome-A  extractable nuclear antibody   Sjogrens syndrome-B extractable nuclear antibody   Anti-DNA antibody, double-stranded   C3 and C4   No orders of the defined types were placed in this encounter.   Follow-Up Instructions: No follow-ups on file.   Collier Salina, MD  Note - This record has been created using [employer].  Chart creation errors have been sought, but may not always  have been located. Such creation errors do not reflect on  the standard of medical care.

## 2020-10-16 ENCOUNTER — Other Ambulatory Visit: Payer: Self-pay

## 2020-10-16 ENCOUNTER — Ambulatory Visit (INDEPENDENT_AMBULATORY_CARE_PROVIDER_SITE_OTHER): Payer: BC Managed Care – PPO | Admitting: Internal Medicine

## 2020-10-16 ENCOUNTER — Encounter: Payer: Self-pay | Admitting: Internal Medicine

## 2020-10-16 VITALS — BP 115/69 | HR 78 | Resp 14 | Ht 63.25 in | Wt 196.0 lb

## 2020-10-16 DIAGNOSIS — M25542 Pain in joints of left hand: Secondary | ICD-10-CM | POA: Diagnosis not present

## 2020-10-16 DIAGNOSIS — R21 Rash and other nonspecific skin eruption: Secondary | ICD-10-CM | POA: Insufficient documentation

## 2020-10-16 DIAGNOSIS — M25541 Pain in joints of right hand: Secondary | ICD-10-CM

## 2020-10-16 DIAGNOSIS — M2550 Pain in unspecified joint: Secondary | ICD-10-CM | POA: Insufficient documentation

## 2020-10-16 DIAGNOSIS — R768 Other specified abnormal immunological findings in serum: Secondary | ICD-10-CM | POA: Diagnosis not present

## 2020-10-16 NOTE — Patient Instructions (Signed)
Anti-DNA Antibody Test Why am I having this test? The anti-DNA antibody test helps with the diagnosis and follow-up of systemic lupus erythematosus (SLE). It is also used to monitor treatment of this condition as the antibody decreases with successful therapy. What is being tested? This test measures the amount of anti-DNA antibody in the blood. This antibody is found in 65-80% of patients with active SLE. This antibody is not as common in patients who have other diseases. What kind of sample is taken?  A blood sample is required for this test. It is usually collected by inserting a needle into a blood vessel. How are the results reported? Your test results will be reported as a value. Your test results may also be reported as positive, intermediate, or negative. Your health care provider will compare your results to normal ranges that were established after testing a large group of people (reference values). Reference values may vary among labs and hospitals. For this test, common reference values are:  Positive: 10 or more international units/mL.  Intermediate: 5-9 international units/mL.  Negative: Less than 5 international units/mL. What do the results mean? Positive results, which are associated with results that are higher than the reference values, may indicate:  Autoimmune disorders such as SLE.  Infectious mononucleosis.  Chronic liver conditions. Intermediate results mean that the anti-DNA antibody levels are higher than normal, but not high enough to be considered positive. Negative results mean that you do not have the anti-DNA antibody that is associated with these conditions. Talk with your health care provider about what your results mean. Questions to ask your health care provider Ask your health care provider, or the department that is doing the test:  When will my results be ready?  How will I get my results?  What are my treatment options?  What other tests do I  need?  What are my next steps? Summary  The anti-DNA antibody test helps with the diagnosis and follow-up of systemic lupus erythematosus (SLE). It is also used to monitor treatment of this condition as the antibody decreases with successful therapy.  This test measures the amount of anti-DNA antibody in the blood.  Elevated levels of anti-DNA antibody can be seen in patients with SLE and certain other conditions.    Complement Assay Test Why am I having this test? Complement refers to a group of proteins that are part of the body's disease-fighting (immune) system. A complement assay test provides information about some or all of these proteins. You may have this test:  To diagnose a lack (deficiency) of certain complement proteins. Deficiencies can be passed from parent to child (inherited).  To monitor an infection or autoimmune disease.  If you have unexplained inflammation or swelling (edema).  If you have repeated bacterial infections. What is being tested? This test can be used to measure:  The total number of complements in your blood (total complement).  The number of each kind of complement in your blood. The nine main kinds of complement are labeled C1 through C9. Some of these components, such as C3 and C4, are particularly important and have many functions in the body. Depending on why you are having the test, your health care provider may test your total complement or only some individual complements, such as C3 and C4. The total complement assay may be done before individual complements are tested. What kind of sample is taken?  A blood sample is required for this test. It is usually collected by inserting  a needle into a blood vessel. Tell a health care provider about:  Any allergies you have.  All medicines you are taking, including vitamins, herbs, eye drops, creams, and over-the-counter medicines.  Any blood disorders you have.  Any surgeries you have  had.  Any medical conditions you have.  Whether you are pregnant or may be pregnant. How are the results reported? Your results will be reported as a value that tells you how much complement is in your blood. This will be given as units per milliliter of blood (units/mL) or as milligrams per deciliter of blood (mg/dL). Your results may be reported as total complement, or as individual complements, or both. Your health care provider will compare your results to normal ranges that were established after testing a large group of people (reference ranges). Reference ranges may vary among labs and hospitals. For this test, reference ranges for some of the most commonly measured complement assays may be:  Total complement: 30-75 units/mL.  C2: 1-4 mg/dL.  C3: 75-175 mg/dL.  C4: 22-45 units/mL. What do the results mean? Results within reference ranges are considered normal, meaning that you have a normal amount of complement in your blood. Results that are higher than the reference ranges may be caused by:  Inflammatory diseases.  Heart attack.  Cancer. Complement deficiencies, or results lower than the reference ranges, may be caused by:  Certain inherited conditions.  Autoimmune disease.  Certain liver diseases.  Malnutrition.  Certain types of anemia that result in breakdown of red blood cells (hemolytic anemia). Talk with your health care provider about what your results mean. Questions to ask your health care provider Ask your health care provider, or the department that is doing the test:  When will my results be ready?  How will I get my results?  What are my treatment options?  What other tests do I need?  What are my next steps? Summary  Complement refers to a group of proteins that are part of the body's disease-fighting (immune) system. A complement assay test can provide information about some or all of these proteins.  A complement assay test may be done to  help diagnose a complement deficiency, and to monitor certain infections or autoimmune diseases.  Talk with your health care provider about what your results mean. This information is not intended to replace advice given to you by your health care provider. Make sure you discuss any questions you have with your health care provider. Document Revised: 05/18/2017 Document Reviewed: 05/18/2017 Elsevier Patient Education  [place].

## 2020-10-17 LAB — ANTI-DNA ANTIBODY, DOUBLE-STRANDED: ds DNA Ab: 1 IU/mL

## 2020-10-17 LAB — C3 AND C4
C3 Complement: 151 mg/dL (ref 83–193)
C4 Complement: 26 mg/dL (ref 15–57)

## 2020-10-17 LAB — SJOGRENS SYNDROME-B EXTRACTABLE NUCLEAR ANTIBODY: SSB (La) (ENA) Antibody, IgG: <1 AI

## 2020-10-17 LAB — ANTI-SMITH ANTIBODY: ENA SM Ab Ser-aCnc: <1 AI

## 2020-10-17 LAB — SJOGRENS SYNDROME-A EXTRACTABLE NUCLEAR ANTIBODY: SSA (Ro) (ENA) Antibody, IgG: <1 AI

## 2020-10-17 NOTE — Progress Notes (Signed)
Lab tests more specific for lupus are all negative and I do not think she needs any more workup for this abnormal ANA test. I do recommend she follow up with dermatology as we discussed in clinic and has scheduled for March, for skin lesion and can follow up for rashes.

## 2020-11-01 ENCOUNTER — Other Ambulatory Visit: Payer: Self-pay | Admitting: Physician Assistant

## 2020-11-01 MED ORDER — AMPHETAMINE-DEXTROAMPHET ER 10 MG PO CP24: 10.0000 mg | ORAL_CAPSULE | ORAL | 0 refills | Status: DC

## 2020-11-01 NOTE — Telephone Encounter (Signed)
LAST APPOINTMENT DATE: 08/13/2020   NEXT APPOINTMENT DATE: Visit date not found    LAST REFILL: 10/12/2020  QTY: 30

## 2020-12-15 ENCOUNTER — Other Ambulatory Visit: Payer: Self-pay | Admitting: Physician Assistant

## 2020-12-18 MED ORDER — AMPHETAMINE-DEXTROAMPHET ER 10 MG PO CP24: 10.0000 mg | ORAL_CAPSULE | ORAL | 0 refills | Status: DC

## 2020-12-25 ENCOUNTER — Telehealth (INDEPENDENT_AMBULATORY_CARE_PROVIDER_SITE_OTHER): Payer: BC Managed Care – PPO | Admitting: Physician Assistant

## 2020-12-25 ENCOUNTER — Encounter: Payer: Self-pay | Admitting: Physician Assistant

## 2020-12-25 VITALS — Ht 63.25 in | Wt 185.0 lb

## 2020-12-25 DIAGNOSIS — A609 Anogenital herpesviral infection, unspecified: Secondary | ICD-10-CM

## 2020-12-25 DIAGNOSIS — R6889 Other general symptoms and signs: Secondary | ICD-10-CM

## 2020-12-25 MED ORDER — DOXYCYCLINE HYCLATE 100 MG PO TABS: 100.0000 mg | ORAL_TABLET | Freq: Two times a day (BID) | ORAL | 0 refills | Status: DC

## 2020-12-25 MED ORDER — VALACYCLOVIR HCL 500 MG PO TABS: 500.0000 mg | ORAL_TABLET | Freq: Every day | ORAL | 3 refills | Status: DC

## 2020-12-25 NOTE — Progress Notes (Signed)
Virtual Visit via Video   I connected with Rachel Paul on 12/25/20 at 10:00 AM EST by a video enabled telemedicine application and verified that I am speaking with the correct person using two identifiers. Location patient: Home Location provider: [facility], Office Persons participating in the virtual visit: MICKAELA STARLIN, [name] PA-C, Anselmo Pickler, LPN   I discussed the limitations of evaluation and management by telemedicine and the availability of in person appointments. The patient expressed understanding and agreed to proceed.  I acted as a Education administrator for [employer], PA-C [employer], LPN   Subjective:   HPI:   Herpes outbreak Pt c/o genital herpes outbreak 2 weeks ago, Valtrex 500 mg BID x 3 days. Needs refill. Pt is concerned about increase in outbreaks and wants to discuss taking Valtrex daily. She has recently started working at a brewery and suspects that increase in sugar intake has caused her to have more outbreaks. She has never been on daily medication but is agreeable.  Flu-like symptoms Last night she had a fever of 101.3. Developed body aches, headaches and throat pain. Was doing an activity in the woods over the weekend and got bit by a bug on her back. Had tender rash but this has improved somewhat. Has had reactions in the past to bug bites or tick bites. Denies: neck stiffness, nausea, vomiting, diarrhea.  ROS: See pertinent positives and negatives per HPI.  Patient Active Problem List   Diagnosis Date Noted  . Positive ANA (antinuclear antibody) 10/16/2020  . Facial rash 10/16/2020  . Arthralgia 10/16/2020  . Vegan 04/06/2020  . Anxiety and depression 04/06/2020  . HSV (herpes simplex virus) anogenital infection 04/06/2017  . Dysmenorrhea 04/12/2015    Social History   Tobacco Use  . Smoking status: Current Some Day Smoker    Types: Cigarettes  . Smokeless tobacco: Never Used  . Tobacco comment: patient says she smokes 1 per  week- smokes more if she's drinkning  Substance Use Topics  . Alcohol use: Yes    Alcohol/week: 1.0 standard drink    Types: 1 Cans of beer per week    Current Outpatient Medications:  .  amphetamine-dextroamphetamine (ADDERALL XR) 10 MG 24 hr capsule, Take 1 capsule (10 mg total) by mouth every morning., Disp: 30 capsule, Rfl: 0 .  doxycycline (VIBRA-TABS) 100 MG tablet, Take 1 tablet (100 mg total) by mouth 2 (two) times daily., Disp: 20 tablet, Rfl: 0 .  LORazepam (ATIVAN) 0.5 MG tablet, Take 1 tablet (0.5 mg total) by mouth daily as needed for anxiety., Disp: 30 tablet, Rfl: 0 .  valACYclovir (VALTREX) 500 MG tablet, Take 1 tablet (500 mg total) by mouth daily., Disp: 90 tablet, Rfl: 3  Allergies  Allergen Reactions  . Amoxicillin Hives    Other reaction(s): Unknown  . Azithromycin Itching and Swelling    Other reaction(s): Unknown  . Levaquin [Levofloxacin In D5w] Hives  . Levofloxacin     Other reaction(s): Unknown    Objective:   VITALS: Per patient if applicable, see vitals. GENERAL: Alert, appears well and in no acute distress. HEENT: Atraumatic, conjunctiva clear, no obvious abnormalities on inspection of external nose and ears. NECK: Normal movements of the head and neck. CARDIOPULMONARY: No increased WOB. Speaking in clear sentences. I:E ratio WNL.  MS: Moves all visible extremities without noticeable abnormality. PSYCH: Pleasant and cooperative, well-groomed. Speech normal rate and rhythm. Affect is appropriate. Insight and judgement are appropriate. Attention is focused, linear, and appropriate.  NEURO: CN grossly intact. Oriented as arrived to appointment on time with no prompting. Moves both UE equally.  SKIN: No obvious wounds, erythema, or cyanosis noted on face or hands. Well demarcated circular area of erythema to mid L back with central punctate lesion, no obvious swelling or discharge  Assessment and Plan:   Lalanya was seen today for herpes  zoster.  Diagnoses and all orders for this visit:  HSV (herpes simplex virus) anogenital infection Will start daily suppressive treatment of valtrex 500 mg daily. Follow-up in 6 months to see if we would like to trial coming off of this. If ineffective at reducing outbreaks, will increase daily dosage.  Flu-like symptoms No red flags on exam. She is in NAD and appears non-toxic. I offered drive up COVID test, strep test and flu test but she declined. Will empirically treat bug bite/skin infection with oral doxycycline. Reviewed return precautions including worsening fever, SOB, worsening cough or other concerns. Push fluids and rest. I recommend that patient follow-up if symptoms worsen or persist despite treatment x 7-10 days, sooner if needed.  Other orders -     valACYclovir (VALTREX) 500 MG tablet; Take 1 tablet (500 mg total) by mouth daily. -     doxycycline (VIBRA-TABS) 100 MG tablet; Take 1 tablet (100 mg total) by mouth 2 (two) times daily.   I discussed the assessment and treatment plan with the patient. The patient was provided an opportunity to ask questions and all were answered. The patient agreed with the plan and demonstrated an understanding of the instructions.   The patient was advised to call back or seek an in-person evaluation if the symptoms worsen or if the condition fails to improve as anticipated.   CMA or LPN served as scribe during this visit. History, Physical, and Plan performed by medical provider. The above documentation has been reviewed and is accurate and complete.   [place], [place] 12/25/2020

## 2021-01-15 ENCOUNTER — Ambulatory Visit (INDEPENDENT_AMBULATORY_CARE_PROVIDER_SITE_OTHER): Payer: Self-pay | Admitting: Physician Assistant

## 2021-01-15 ENCOUNTER — Other Ambulatory Visit: Payer: Self-pay

## 2021-01-15 ENCOUNTER — Encounter: Payer: Self-pay | Admitting: Physician Assistant

## 2021-01-15 DIAGNOSIS — D1800 Hemangioma unspecified site: Secondary | ICD-10-CM

## 2021-01-15 DIAGNOSIS — L719 Rosacea, unspecified: Secondary | ICD-10-CM

## 2021-01-15 DIAGNOSIS — D229 Melanocytic nevi, unspecified: Secondary | ICD-10-CM

## 2021-01-15 DIAGNOSIS — L219 Seborrheic dermatitis, unspecified: Secondary | ICD-10-CM

## 2021-01-15 DIAGNOSIS — D2239 Melanocytic nevi of other parts of face: Secondary | ICD-10-CM

## 2021-01-15 DIAGNOSIS — Z1283 Encounter for screening for malignant neoplasm of skin: Secondary | ICD-10-CM

## 2021-01-15 MED ORDER — ALCLOMETASONE DIPROPIONATE 0.05 % EX CREA
TOPICAL_CREAM | CUTANEOUS | 6 refills | Status: AC
Start: 2021-01-15 — End: ?

## 2021-01-15 MED ORDER — KETOCONAZOLE 2 % EX SHAM
MEDICATED_SHAMPOO | CUTANEOUS | 8 refills | Status: DC
Start: 2021-01-15 — End: 2024-01-12

## 2021-01-15 MED ORDER — METRONIDAZOLE 0.75 % EX GEL: CUTANEOUS | 6 refills | Status: DC

## 2021-01-15 NOTE — Progress Notes (Signed)
   New Patient   Subjective  Rachel Paul is a 30 y.o. female who presents for the following: Annual Exam (Left inner eye dark mole x years getting darker).   The following portions of the chart were reviewed this encounter and updated as appropriate:      Objective  Well appearing patient in no apparent distress; mood and affect are within normal limits.  A full examination was performed including scalp, head, eyes, ears, nose, lips, neck, chest, axillae, abdomen, back, buttocks, bilateral upper extremities, bilateral lower extremities, hands, feet, fingers, toes, fingernails, and toenails. All findings within normal limits unless otherwise noted below.  Objective  head to toe: No atypical nevi No signs of non-mole skin cancer. Full body skin examination  Objective  Left Upper Back: Multiple red raised papules  Objective  both cheeks: Centrifacial erythema without papules/pustules.   Objective  Left Nasal Sidewall: Tan-brown symmetric macules and papules.   Images    Objective  Mid Frontal Scalp: Thin scaly erythematous papules coalescing to plaques.    Assessment & Plan  Encounter for screening for malignant neoplasm of skin head to toe  Yearly skin check  Hemangioma, unspecified site Left Upper Back  Okay to leave if stable  Rosacea both cheeks  metroNIDAZOLE (METROGEL) 0.75 % gel - both cheeks  Ordered Medications: alclomethasone (ACLOVATE) 0.05 % cream  Nevus Left Nasal Sidewall  Return to clinic if any changes. Yearly exams  Seborrheic dermatitis Mid Frontal Scalp  ketoconazole (NIZORAL) 2 % shampoo - Mid Frontal Scalp  alclomethasone (ACLOVATE) 0.05 % cream - Mid Frontal Scalp    I, Nikoletta Varma, PA-C, have reviewed all documentation for this visit. The documentation on 01/15/21 for the exam, diagnosis, procedures, and orders are all accurate and complete.

## 2021-02-01 ENCOUNTER — Other Ambulatory Visit: Payer: Self-pay | Admitting: Physician Assistant

## 2021-02-01 MED ORDER — AMPHETAMINE-DEXTROAMPHET ER 10 MG PO CP24: 10.0000 mg | ORAL_CAPSULE | ORAL | 0 refills | Status: DC

## 2021-02-01 NOTE — Telephone Encounter (Signed)
Rx Request 

## 2021-03-20 ENCOUNTER — Encounter: Payer: Self-pay | Admitting: Physician Assistant

## 2021-03-28 ENCOUNTER — Other Ambulatory Visit: Payer: Self-pay | Admitting: Physician Assistant

## 2021-03-28 MED ORDER — AMPHETAMINE-DEXTROAMPHET ER 10 MG PO CP24: 10.0000 mg | ORAL_CAPSULE | ORAL | 0 refills | Status: DC

## 2021-05-14 ENCOUNTER — Other Ambulatory Visit: Payer: Self-pay | Admitting: Family Medicine

## 2021-05-14 ENCOUNTER — Other Ambulatory Visit: Payer: Self-pay | Admitting: Physician Assistant

## 2021-05-14 MED ORDER — LORAZEPAM 0.5 MG PO TABS: 0.5000 mg | ORAL_TABLET | Freq: Every day | ORAL | 0 refills | Status: DC | PRN

## 2021-05-14 MED ORDER — AMPHETAMINE-DEXTROAMPHET ER 10 MG PO CP24: 10.0000 mg | ORAL_CAPSULE | ORAL | 0 refills | Status: DC

## 2021-08-12 ENCOUNTER — Other Ambulatory Visit: Payer: Self-pay | Admitting: Family Medicine

## 2021-08-12 NOTE — Telephone Encounter (Signed)
Patient is scheduled for 08/14/21.

## 2021-08-12 NOTE — Telephone Encounter (Signed)
Needs an appointment for further refills.

## 2021-08-13 NOTE — Progress Notes (Signed)
Rachel Paul is a 30 y.o. female here for medication refill.  History of Present Illness:   Chief Complaint  Patient presents with   Medication Refill    HPI   Anxiety [name] presents today with feelings of wanting to increase her lorazepam 0.5 mg. 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  [name] was previously on lexapro, where she experienced feelings of extreme fatigue and personality changes. She does not want to trial another SSRI. Denies SI/HI.  ADHD [name] is currently taking Adderall XR 10 mg two times a week.  She is open to increasing her dosage to 15 mg since she believes she could benefit from the dosage. Currently she is not attending therapy, but is open to looking for a provider.   Past Medical History:  Diagnosis Date   Anxiety    Asthma    Depression    Dysmenorrhea    Pyelonephritis      Social History   Tobacco Use   Smoking status: Some Days    Types: Cigarettes   Smokeless tobacco: Never   Tobacco comments:    patient says she smokes 1 per week- smokes more if she's drinkning  Vaping Use   Vaping Use: Never used  Substance Use Topics   Alcohol use: Yes    Alcohol/week: 1.0 standard drink    Types: 1 Cans of beer per week   Drug use: Yes    Types: Marijuana    Past Surgical History:  Procedure Laterality Date   WISDOM TOOTH EXTRACTION      Family History  Problem Relation Age of Onset   Thyroid disease Mother    Asthma Mother    Sleep apnea Mother    Asthma Brother    Ovarian cancer Maternal Grandmother    Diabetes Maternal Grandmother    Cancer Maternal Grandmother    Diabetes Maternal Grandfather    Diabetes Paternal Grandfather    Prostate cancer Paternal Grandfather    Skin cancer Father    Sleep apnea Brother    Colon cancer Neg Hx    Breast cancer Neg Hx     Allergies  Allergen Reactions   Amoxicillin Hives    Other reaction(s):  Unknown   Azithromycin Itching and Swelling    Other reaction(s): Unknown   Levaquin [Levofloxacin In D5w] Hives   Levofloxacin     Other reaction(s): Unknown    Current Medications:   Current Outpatient Medications:    alclomethasone (ACLOVATE) 0.05 % cream, Apply to affected area every morning, Disp: 180 g, Rfl: 6   amphetamine-dextroamphetamine (ADDERALL XR) 15 MG 24 hr capsule, Take 1 capsule by mouth every morning., Disp: 30 capsule, Rfl: 0   [START ON 09/13/2021] amphetamine-dextroamphetamine (ADDERALL XR) 15 MG 24 hr capsule, Take 1 capsule by mouth every morning., Disp: 30 capsule, Rfl: 0   [START ON 10/13/2021] amphetamine-dextroamphetamine (ADDERALL XR) 15 MG 24 hr capsule, Take 1 capsule by mouth every morning., Disp: 30 capsule, Rfl: 0   ketoconazole (NIZORAL) 2 % shampoo, Apply to affected area- let sit 3-5 minutes before rinsing, Disp: 120 mL, Rfl: 8   propranolol (INDERAL) 20 MG tablet, Take 1 tablet (20 mg total) by mouth 3 (three) times daily as needed., Disp: 90 tablet, Rfl: 1   valACYclovir (VALTREX) 500 MG tablet, Take 1 tablet (500 mg total) by mouth daily., Disp: 90 tablet, Rfl: 3   LORazepam (ATIVAN) 0.5  MG tablet, Take 1 tablet (0.5 mg total) by mouth daily as needed for anxiety., Disp: 30 tablet, Rfl: 0   Review of Systems:   ROS Negative unless otherwise specified per HPI.  Vitals:   Vitals:   08/14/21 0909  BP: 110/80  Pulse: 72  Temp: 97.8 F (36.6 C)  TempSrc: Temporal  SpO2: 98%  Weight: 192 lb (87.1 kg)  Height: 5' 3.25" (1.607 m)     Body mass index is 33.74 kg/m.  Physical Exam:   Physical Exam Vitals and nursing note reviewed.  Constitutional:      General: She is not in acute distress.    Appearance: She is well-developed. She is not ill-appearing or toxic-appearing.  Cardiovascular:     Rate and Rhythm: Normal rate and regular rhythm.     Pulses: Normal pulses.     Heart sounds: Normal heart sounds, S1 normal and S2 normal.   Pulmonary:     Effort: Pulmonary effort is normal.     Breath sounds: Normal breath sounds.  Skin:    General: Skin is warm and dry.  Neurological:     Mental Status: She is alert.     GCS: GCS eye subscore is 4. GCS verbal subscore is 5. GCS motor subscore is 6.  Psychiatric:        Speech: Speech normal.        Behavior: Behavior normal. Behavior is cooperative.    Assessment and Plan:   Anxiety and depression Uncontrolled She would like to avoid SSRIs Trial 20 mg propranolol as needed up to 3 times daily for anxiety If she does not like this, we will trial buspar Refill of lorazepam 0.5 mg prn Follow-up in 3-6 months, sooner if concerns I discussed with patient that if they develop any SI, to tell someone immediately and seek medical attention.  Attention deficit hyperactivity disorder (ADHD), unspecified ADHD type Slightly uncontrolled Increase Adderall XR to 15 mg daily Follow-up in 3-6 months, sooner if concerns  I,[name],acting as a scribe for [employer], PA.,have documented all relevant documentation on the behalf of [name], PA,as directed by  [name], PA while in the presence of [name], [place].   I, [name], [place], have reviewed all documentation for this visit. The documentation on 08/14/21 for the exam, diagnosis, procedures, and orders are all accurate and complete.   [name], PA-C

## 2021-08-14 ENCOUNTER — Other Ambulatory Visit: Payer: Self-pay

## 2021-08-14 ENCOUNTER — Ambulatory Visit: Payer: No Typology Code available for payment source | Admitting: Physician Assistant

## 2021-08-14 ENCOUNTER — Encounter: Payer: Self-pay | Admitting: Physician Assistant

## 2021-08-14 VITALS — BP 110/80 | HR 72 | Temp 97.8°F | Ht 63.25 in | Wt 192.0 lb

## 2021-08-14 DIAGNOSIS — F909 Attention-deficit hyperactivity disorder, unspecified type: Secondary | ICD-10-CM

## 2021-08-14 DIAGNOSIS — F419 Anxiety disorder, unspecified: Secondary | ICD-10-CM | POA: Diagnosis not present

## 2021-08-14 DIAGNOSIS — F32A Depression, unspecified: Secondary | ICD-10-CM

## 2021-08-14 MED ORDER — AMPHETAMINE-DEXTROAMPHET ER 15 MG PO CP24: 15.0000 mg | ORAL_CAPSULE | ORAL | 0 refills | Status: DC

## 2021-08-14 MED ORDER — PROPRANOLOL HCL 20 MG PO TABS: 20.0000 mg | ORAL_TABLET | Freq: Three times a day (TID) | ORAL | 1 refills | Status: DC | PRN

## 2021-08-14 MED ORDER — LORAZEPAM 0.5 MG PO TABS: 0.5000 mg | ORAL_TABLET | Freq: Every day | ORAL | 0 refills | Status: DC | PRN

## 2021-08-14 MED ORDER — AMPHETAMINE-DEXTROAMPHET ER 15 MG PO CP24
15.0000 mg | ORAL_CAPSULE | ORAL | 0 refills | Status: DC
Start: 2021-10-13 — End: 2022-01-07

## 2021-08-14 NOTE — Patient Instructions (Signed)
It was great to see you!  Increase of Adderall XR 15 mg daily  Trial 20 mg propranolol as needed up to 3 times daily for anxiety Let me know how this works for you  Let's follow-up in 3-6 months, sooner if you have concerns.  Take care,  [name] PA-C

## 2021-09-06 ENCOUNTER — Ambulatory Visit (HOSPITAL_BASED_OUTPATIENT_CLINIC_OR_DEPARTMENT_OTHER): Payer: Self-pay | Admitting: Medical

## 2021-09-13 ENCOUNTER — Other Ambulatory Visit: Payer: Self-pay | Admitting: Physician Assistant

## 2021-10-14 ENCOUNTER — Other Ambulatory Visit: Payer: Self-pay | Admitting: Physician Assistant

## 2021-11-05 ENCOUNTER — Other Ambulatory Visit: Payer: Self-pay | Admitting: Physician Assistant

## 2021-11-05 MED ORDER — AMPHETAMINE-DEXTROAMPHET ER 15 MG PO CP24: 15.0000 mg | ORAL_CAPSULE | ORAL | 0 refills | Status: DC

## 2021-12-29 ENCOUNTER — Other Ambulatory Visit: Payer: Self-pay | Admitting: Physician Assistant

## 2021-12-30 ENCOUNTER — Other Ambulatory Visit (HOSPITAL_COMMUNITY): Payer: Self-pay

## 2021-12-30 MED ORDER — AMPHETAMINE-DEXTROAMPHET ER 15 MG PO CP24
15.0000 mg | ORAL_CAPSULE | ORAL | 0 refills | Status: DC
  Filled 2021-12-30: qty 30, 30d supply, fill #0

## 2022-01-07 ENCOUNTER — Other Ambulatory Visit: Payer: Self-pay | Admitting: Physician Assistant

## 2022-01-07 MED ORDER — AMPHETAMINE-DEXTROAMPHET ER 15 MG PO CP24: 15.0000 mg | ORAL_CAPSULE | ORAL | 0 refills | Status: DC

## 2022-02-12 ENCOUNTER — Ambulatory Visit (INDEPENDENT_AMBULATORY_CARE_PROVIDER_SITE_OTHER): Payer: 59 | Admitting: Physician Assistant

## 2022-02-12 ENCOUNTER — Encounter: Payer: Self-pay | Admitting: Physician Assistant

## 2022-02-12 ENCOUNTER — Other Ambulatory Visit (HOSPITAL_COMMUNITY)
Admission: RE | Admit: 2022-02-12 | Discharge: 2022-02-12 | Disposition: A | Discharge status: Home / Self Care | Payer: 59 | Source: Ambulatory Visit | Attending: Physician Assistant | Admitting: Physician Assistant

## 2022-02-12 VITALS — BP 124/80 | HR 75 | Temp 98.1°F | Ht 63.78 in | Wt 187.0 lb

## 2022-02-12 DIAGNOSIS — R102 Pelvic and perineal pain: Secondary | ICD-10-CM

## 2022-02-12 DIAGNOSIS — A609 Anogenital herpesviral infection, unspecified: Secondary | ICD-10-CM

## 2022-02-12 DIAGNOSIS — Z136 Encounter for screening for cardiovascular disorders: Secondary | ICD-10-CM

## 2022-02-12 DIAGNOSIS — F419 Anxiety disorder, unspecified: Secondary | ICD-10-CM

## 2022-02-12 DIAGNOSIS — Z0001 Encounter for general adult medical examination with abnormal findings: Secondary | ICD-10-CM | POA: Diagnosis not present

## 2022-02-12 DIAGNOSIS — F32A Depression, unspecified: Secondary | ICD-10-CM

## 2022-02-12 DIAGNOSIS — F909 Attention-deficit hyperactivity disorder, unspecified type: Secondary | ICD-10-CM | POA: Diagnosis not present

## 2022-02-12 DIAGNOSIS — Z13220 Encounter for screening for lipoid disorders: Secondary | ICD-10-CM | POA: Diagnosis not present

## 2022-02-12 DIAGNOSIS — K6289 Other specified diseases of anus and rectum: Secondary | ICD-10-CM

## 2022-02-12 LAB — CBC WITH DIFFERENTIAL/PLATELET
Basophils Absolute: 0.1 10*3/uL (ref 0.0–0.1)
Basophils Relative: 1 % (ref 0.0–3.0)
Eosinophils Absolute: 0.1 10*3/uL (ref 0.0–0.7)
Eosinophils Relative: 1.5 % (ref 0.0–5.0)
HCT: 37.2 % (ref 36.0–46.0)
Hemoglobin: 13 g/dL (ref 12.0–15.0)
Lymphocytes Relative: 37.5 % (ref 12.0–46.0)
Lymphs Abs: 2.1 10*3/uL (ref 0.7–4.0)
MCHC: 34.8 g/dL (ref 30.0–36.0)
MCV: 94.7 fl (ref 78.0–100.0)
Monocytes Absolute: 0.4 10*3/uL (ref 0.1–1.0)
Monocytes Relative: 7.3 % (ref 3.0–12.0)
Neutro Abs: 2.9 10*3/uL (ref 1.4–7.7)
Neutrophils Relative %: 52.7 % (ref 43.0–77.0)
Platelets: 253 10*3/uL (ref 150.0–400.0)
RBC: 3.93 Mil/uL (ref 3.87–5.11)
RDW: 12.4 % (ref 11.5–15.5)
WBC: 5.6 10*3/uL (ref 4.0–10.5)

## 2022-02-12 LAB — COMPREHENSIVE METABOLIC PANEL
ALT: 14 U/L (ref 0–35)
AST: 33 U/L (ref 0–37)
Albumin: 4.7 g/dL (ref 3.5–5.2)
Alkaline Phosphatase: 46 U/L (ref 39–117)
BUN: 16 mg/dL (ref 6–23)
CO2: 27 mEq/L (ref 19–32)
Calcium: 9.9 mg/dL (ref 8.4–10.5)
Chloride: 106 mEq/L (ref 96–112)
Creatinine, Ser: 0.74 mg/dL (ref 0.40–1.20)
GFR: 107.11 mL/min (ref >60.00)
Glucose, Bld: 89 mg/dL (ref 70–99)
Potassium: 4.8 mEq/L (ref 3.5–5.1)
Sodium: 138 mEq/L (ref 135–145)
Total Bilirubin: 0.6 mg/dL (ref 0.2–1.2)
Total Protein: 7.6 g/dL (ref 6.0–8.3)

## 2022-02-12 LAB — LIPID PANEL
Cholesterol: 135 mg/dL (ref 0–200)
HDL: 40.7 mg/dL (ref >39.00)
LDL Cholesterol: 78 mg/dL (ref 0–99)
NonHDL: 94.1
Total CHOL/HDL Ratio: 3
Triglycerides: 79 mg/dL (ref 0.0–149.0)
VLDL: 15.8 mg/dL (ref 0.0–40.0)

## 2022-02-12 MED ORDER — VALACYCLOVIR HCL 500 MG PO TABS: 500.0000 mg | ORAL_TABLET | Freq: Every day | ORAL | 1 refills | Status: DC

## 2022-02-12 MED ORDER — AMPHETAMINE-DEXTROAMPHETAMINE 5 MG PO TABS: 5.0000 mg | ORAL_TABLET | Freq: Every day | ORAL | 0 refills | Status: DC

## 2022-02-12 MED ORDER — AMPHETAMINE-DEXTROAMPHET ER 10 MG PO CP24: 10.0000 mg | ORAL_CAPSULE | Freq: Every day | ORAL | 0 refills | Status: DC
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
## 2022-02-12 NOTE — Patient Instructions (Addendum)
It was great to see you! ? ?Trial Adderall 10 mg xr with Adderall 5 mg ? ?Referral to gynecology ? ?Please go to the lab for blood work.  ? ?Our office will call you with your results unless you have chosen to receive results via MyChart. ? ?If your blood work is normal we will follow-up each year for physicals and as scheduled for chronic medical problems. ? ?If anything is abnormal we will treat accordingly and get you in for a follow-up. ? ?Take care, ? ?Aldona Bar ?  ?

## 2022-02-13 LAB — CERVICOVAGINAL ANCILLARY ONLY
Bacterial Vaginitis (gardnerella): NEGATIVE
Candida Glabrata: NEGATIVE
Candida Vaginitis: NEGATIVE
Comment: NEGATIVE
Comment: NEGATIVE
Comment: NEGATIVE

## 2022-03-27 ENCOUNTER — Inpatient Hospital Stay (HOSPITAL_COMMUNITY): Payer: 59

## 2022-03-27 ENCOUNTER — Inpatient Hospital Stay (HOSPITAL_COMMUNITY)
Admission: AD | Admit: 2022-03-27 | Discharge: 2022-03-27 | Disposition: A | Discharge status: Home / Self Care | Payer: 59 | Attending: Obstetrics & Gynecology | Admitting: Obstetrics & Gynecology

## 2022-03-27 ENCOUNTER — Encounter (HOSPITAL_COMMUNITY): Payer: Self-pay | Admitting: *Deleted

## 2022-03-27 ENCOUNTER — Other Ambulatory Visit: Payer: Self-pay

## 2022-03-27 DIAGNOSIS — Q513 Bicornate uterus: Secondary | ICD-10-CM | POA: Insufficient documentation

## 2022-03-27 DIAGNOSIS — Z3A00 Weeks of gestation of pregnancy not specified: Secondary | ICD-10-CM | POA: Diagnosis not present

## 2022-03-27 DIAGNOSIS — O3680X0 Pregnancy with inconclusive fetal viability, not applicable or unspecified: Secondary | ICD-10-CM

## 2022-03-27 DIAGNOSIS — O3400 Maternal care for unspecified congenital malformation of uterus, unspecified trimester: Secondary | ICD-10-CM | POA: Diagnosis not present

## 2022-03-27 LAB — CBC
HCT: 35.9 % — ABNORMAL LOW (ref 36.0–46.0)
Hemoglobin: 12.4 g/dL (ref 12.0–15.0)
MCH: 32.9 pg (ref 26.0–34.0)
MCHC: 34.5 g/dL (ref 30.0–36.0)
MCV: 95.2 fL (ref 80.0–100.0)
Platelets: 232 10*3/uL (ref 150–400)
RBC: 3.77 MIL/uL — ABNORMAL LOW (ref 3.87–5.11)
RDW: 11.9 % (ref 11.5–15.5)
WBC: 5.8 10*3/uL (ref 4.0–10.5)
nRBC: 0 % (ref 0.0–0.2)

## 2022-03-27 LAB — URINALYSIS, ROUTINE W REFLEX MICROSCOPIC
Bilirubin Urine: NEGATIVE
Glucose, UA: NEGATIVE mg/dL
Hgb urine dipstick: NEGATIVE
Ketones, ur: NEGATIVE mg/dL
Leukocytes,Ua: NEGATIVE
Nitrite: NEGATIVE
Protein, ur: NEGATIVE mg/dL
Specific Gravity, Urine: 1.014 (ref 1.005–1.030)
pH: 5 (ref 5.0–8.0)

## 2022-03-27 LAB — HCG, QUANTITATIVE, PREGNANCY: hCG, Beta Chain, Quant, S: 37 m[IU]/mL — ABNORMAL HIGH (ref <5)

## 2022-03-27 LAB — POCT PREGNANCY, URINE: Preg Test, Ur: POSITIVE — AB

## 2022-03-27 LAB — ABO/RH: ABO/RH(D): B POS

## 2022-03-27 NOTE — Discharge Instructions (Signed)
While we do not think you have ectopic pregnancy, since we could not see a pregnancy within the uterus we have to give you information about the possibility of it.

## 2022-03-27 NOTE — MAU Note (Signed)
Rachel Paul is a 31 y.o. at Unknown here in MAU reporting: reports found out pregnant 3 days ago, had sex last night with pink discharge afterwards, but today now having bright red VB with wiping.  Also reports menstrual type cramps. LMP: unsure, end of March Onset of complaint: today Pain score: 4/10 cramping Vitals:   03/27/22 1246  BP: 118/70  Pulse: 75  Resp: 19  Temp: 98.7 F (37.1 C)  SpO2: 100%     FHT:N/A Lab orders placed from triage:   UPT & UA

## 2022-03-27 NOTE — MAU Provider Note (Addendum)
Chief Complaint:  Vaginal Bleeding   Event Date/Time   First Provider Initiated Contact with Patient 03/27/22 1318     HPI: Rachel Paul is a 31 y.o. G1P0 at unknown gestational age (LMP possibly the end of March) who presents to maternity admissions reporting +HPT 3 days ago IC last night followed by pink discharge when she wiped. An hour ago, she noted bright red when she wiped and some mild, diffuse lower abdominal cramping. Also has some mild nausea but has not vomiting and does not request medication. No other physical complaints.  Pregnancy Course: Has not established care anywhere, but has seen Edwinna Areola, MD before and liked her care.  Past Medical History:  Diagnosis Date   Allergy    Anxiety    Asthma    Depression    Dysmenorrhea    Pyelonephritis    OB History  Gravida Para Term Preterm AB Living  1            SAB IAB Ectopic Multiple Live Births               # Outcome Date GA Lbr Len/2nd Weight Sex Delivery Anes PTL Lv  1 Current            Past Surgical History:  Procedure Laterality Date   WISDOM TOOTH EXTRACTION     Family History  Problem Relation Age of Onset   Thyroid disease Mother    Asthma Mother    Sleep apnea Mother    ADD / ADHD Mother    Miscarriages / [place] Mother    Obesity Mother    Skin cancer Father    Hearing loss Father    Asthma Brother    ADD / ADHD Brother    Sleep apnea Brother    ADD / ADHD Brother    Depression Brother    Obesity Brother    Ovarian cancer Maternal Grandmother    Diabetes Maternal Grandmother    Breast cancer Maternal Grandmother        ?   Obesity Maternal Grandmother    Stroke Maternal Grandmother    Diabetes Maternal Grandfather    Alcohol abuse Maternal Grandfather    Early death Maternal Grandfather    Obesity Maternal Grandfather    Anxiety disorder Paternal Grandmother    Diabetes Paternal Grandfather    Prostate cancer Paternal Grandfather    Hearing loss Paternal Grandfather     Colon cancer Neg Hx    Social History   Tobacco Use   Smoking status: Some Days    Packs/day: 0.00    Years: 10.00    Pack years: 0.00    Types: Cigarettes   Smokeless tobacco: Never   Tobacco comments:    Very irregular smoking cigarettes  Vaping Use   Vaping Use: Never used  Substance Use Topics   Alcohol use: Yes    Alcohol/week: 1.0 standard drink    Types: 1 Cans of beer per week    Comment: Rarely drink, once per month   Drug use: Not Currently    Types: Marijuana    Comment: 03/15/22   Allergies  Allergen Reactions   Amoxicillin Hives    Other reaction(s): Unknown   Azithromycin Itching and Swelling    Other reaction(s): Unknown   Levaquin [Levofloxacin In D5w] Hives   Levofloxacin     Other reaction(s): Unknown   No medications prior to admission.   I have reviewed patient's Past Medical Hx, Surgical Hx, Family Hx,  Social Hx, medications and allergies.   ROS:  Pertinent items noted in HPI and remainder of comprehensive ROS otherwise negative.   Physical Exam  Patient Vitals for the past 24 hrs:  BP Temp Temp src Pulse Resp SpO2 Height Weight  03/27/22 1525 133/77 -- -- 76 -- 100 % -- --  03/27/22 1246 118/70 98.7 F (37.1 C) Oral 75 19 100 % -- --  03/27/22 1239 -- -- -- -- -- -- '5\' 3"'$  (1.6 m) 189 lb 8 oz (86 kg)   Constitutional: Well-developed, well-nourished female in no acute distress.  Cardiovascular: normal rate & rhythm, no murmur Respiratory: normal effort GI: Abd soft, non-tender MS: Extremities nontender, no edema, normal ROM Neurologic: Alert and oriented x 4.  GU: no CVA tenderness Pelvic: bleeding minimal so exam deferred, sent straight to U/S   Labs: Results for orders placed or performed during the hospital encounter of 03/27/22 (from the past 24 hour(s))  Urinalysis, Routine w reflex microscopic Urine, Clean Catch     Status: None   Collection Time: 03/27/22 12:42 PM  Result Value Ref Range   Color, Urine YELLOW YELLOW    APPearance CLEAR CLEAR   Specific Gravity, Urine 1.014 1.005 - 1.030   pH 5.0 5.0 - 8.0   Glucose, UA NEGATIVE NEGATIVE mg/dL   Hgb urine dipstick NEGATIVE NEGATIVE   Bilirubin Urine NEGATIVE NEGATIVE   Ketones, ur NEGATIVE NEGATIVE mg/dL   Protein, ur NEGATIVE NEGATIVE mg/dL   Nitrite NEGATIVE NEGATIVE   Leukocytes,Ua NEGATIVE NEGATIVE  Pregnancy, urine POC     Status: Abnormal   Collection Time: 03/27/22 12:43 PM  Result Value Ref Range   Preg Test, Ur POSITIVE (A) NEGATIVE  hCG, quantitative, pregnancy     Status: Abnormal   Collection Time: 03/27/22  1:26 PM  Result Value Ref Range   hCG, Beta Chain, Quant, S 37 (H) <5 mIU/mL  CBC     Status: Abnormal   Collection Time: 03/27/22  1:26 PM  Result Value Ref Range   WBC 5.8 4.0 - 10.5 K/uL   RBC 3.77 (L) 3.87 - 5.11 MIL/uL   Hemoglobin 12.4 12.0 - 15.0 g/dL   HCT 35.9 (L) 36.0 - 46.0 %   MCV 95.2 80.0 - 100.0 fL   MCH 32.9 26.0 - 34.0 pg   MCHC 34.5 30.0 - 36.0 g/dL   RDW 11.9 11.5 - 15.5 %   Platelets 232 150 - 400 K/uL   nRBC 0.0 0.0 - 0.2 %  ABO/Rh     Status: None   Collection Time: 03/27/22  1:26 PM  Result Value Ref Range   ABO/RH(D) B POS    No rh immune globuloin      NOT A RH IMMUNE GLOBULIN CANDIDATE, PT RH POSITIVE Performed at [facility], [place] [address], [place], [place] [address]    Imaging:  US OB LESS THAN 14 WEEKS WITH OB TRANSVAGINAL  Result Date: 03/27/2022 CLINICAL DATA:  Vaginal bleeding EXAM: OBSTETRIC <14 WK [place] AND TRANSVAGINAL OB US TECHNIQUE: Both transabdominal and transvaginal ultrasound examinations were performed for complete evaluation of the gestation as well as the maternal uterus, adnexal regions, and pelvic cul-de-sac. Transvaginal technique was performed to assess early pregnancy. COMPARISON:  None Available. FINDINGS: Intrauterine gestational sac: None Yolk sac:  Not seen Embryo:  Not seen Cardiac Activity: Not seen Subchorionic hemorrhage:  None Maternal uterus/adnexae: There is  thickening of endometrial stripe measuring 10 mm. Cervix is closed. Possible bicornuate uterus. There are  no adnexal masses. There is 11 mm follicle in the right ovary. There is vascular flow in both adnexal regions in color Doppler examination. There is no free fluid in the pelvis. IMPRESSION: There is no demonstrable intrauterine gestational sac. If pregnancy test is positive differential diagnostic possibilities would include very early IUP or failed gestation or ectopic gestation. Serial HCG estimations and short-term follow-up sonogram in 1-2 weeks may be considered. Electronically Signed   By: Elmer Picker M.D.   On: 03/27/2022 14:14    MAU Course: Orders Placed This Encounter  Procedures   US OB LESS THAN 14 WEEKS WITH OB TRANSVAGINAL   Urinalysis, Routine w reflex microscopic Urine, Clean Catch   hCG, quantitative, pregnancy   CBC   Pregnancy, urine POC   ABO/Rh   Discharge patient   No orders of the defined types were placed in this encounter.  MDM: Discussed possible findings with patient and testing plan. Labs drawn and pt sent to ultrasound.  Results back showing pregnancy of unknown location, likely a VERY early pregnancy. Pt reported bleeding and cramping increased after U/S. Explained this can happen but did review ectopic and miscarriage s/sx/precautions. Advised her to return in two days for repeat quant, appt scheduled for after 11am in MAU.   Discussed possible bicornuate uterus found on u/s, pt said she has been told this before but not given any counseling about getting pregnant etc. Offered to set her up with a MD, will send message to [facility] to get her in with Dr. Sabra Heck.  Of note, during her discharge, pt reported to RN that she is planning to go out of town this weekend and if bleeding continues she will likely just go to the beach vs come in for repeat bloodwork.   Assessment: 1. Pregnancy of unknown anatomic location   2. Bicornate uterus     Plan: Discharge home in stable condition with ectopic and bleeding precautions.     Follow-up Information     [facility]. Go in 2 day(s).   Specialty: Obstetrics and Gynecology Why: anytime after 11am Contact information: [address] [phone] [place] [phone]                Allergies as of 03/27/2022       Reactions   Amoxicillin Hives   Other reaction(s): Unknown   Azithromycin Itching, Swelling   Other reaction(s): Unknown   Levaquin [levofloxacin In D5w] Hives   Levofloxacin    Other reaction(s): Unknown        Medication List     TAKE these medications    alclomethasone 0.05 % cream Commonly known as: ACLOVATE Apply to affected area every morning   amphetamine-dextroamphetamine 10 MG 24 hr capsule Commonly known as: Adderall XR Take 1 capsule (10 mg total) by mouth daily.   amphetamine-dextroamphetamine 5 MG tablet Commonly known as: Adderall Take 1 tablet (5 mg total) by mouth daily.   ketoconazole 2 % shampoo Commonly known as: NIZORAL Apply to affected area- let sit 3-5 minutes before rinsing   LORazepam 0.5 MG tablet Commonly known as: ATIVAN TAKE ONE TABLET BY MOUTH DAILY AS NEEDED FOR ANXIETY   propranolol 20 MG tablet Commonly known as: INDERAL Take 1 tablet (20 mg total) by mouth 3 (three) times daily as needed.   valACYclovir 500 MG tablet Commonly known as: Valtrex Take 1 tablet (500 mg total) by mouth daily.       Gaylan Gerold, CNM, MSN,  IBCLC Certified Nurse Midwife, [place]

## 2022-03-28 ENCOUNTER — Telehealth: Payer: Self-pay | Admitting: Advanced Practice Midwife

## 2022-03-28 NOTE — Telephone Encounter (Signed)
Patient called MAU to ask if she can present to repeat stat Quant hCG Monday instead of Saturday. CNM explained significance of change in quant. Patient states she can come Saturday around 0900. CNM confirmed MAU is always open. Patient denies questions or concerns at end of call.  Mallie Snooks, [place], MSN, CNM Certified Nurse Midwife, Product/process development scientist for [employer], [place]

## 2022-03-29 ENCOUNTER — Inpatient Hospital Stay (HOSPITAL_COMMUNITY)
Admission: AD | Admit: 2022-03-29 | Discharge: 2022-03-29 | Disposition: A | Discharge status: Home / Self Care | Payer: 59 | Attending: Obstetrics and Gynecology | Admitting: Obstetrics and Gynecology

## 2022-03-29 ENCOUNTER — Encounter (HOSPITAL_COMMUNITY): Payer: Self-pay

## 2022-03-29 ENCOUNTER — Other Ambulatory Visit (HOSPITAL_COMMUNITY): Payer: 59

## 2022-03-29 ENCOUNTER — Other Ambulatory Visit: Payer: Self-pay

## 2022-03-29 DIAGNOSIS — O039 Complete or unspecified spontaneous abortion without complication: Secondary | ICD-10-CM | POA: Insufficient documentation

## 2022-03-29 DIAGNOSIS — Z3A09 9 weeks gestation of pregnancy: Secondary | ICD-10-CM

## 2022-03-29 LAB — HCG, QUANTITATIVE, PREGNANCY: hCG, Beta Chain, Quant, S: 12 m[IU]/mL — ABNORMAL HIGH (ref <5)

## 2022-03-29 NOTE — MAU Note (Signed)
Pt left the unit after talking with Jorje Guild NP, pt to remain on unit census and NP will call patient with results.

## 2022-03-29 NOTE — MAU Provider Note (Signed)
History   Chief Complaint:  Follow-up   Rachel Paul is  31 y.o. G1P0 at 12w1dby unsure LMP. Patient is here for follow up of quantitative HCG and ongoing surveillance of pregnancy status.   Since her last visit, the patient is without new complaint. The patient reports bleeding as  similar to period.  She continues to have abdominal cramping that has improved since previous visit.   Her previous Quantitative HCG values are:  Component     Latest Ref Rng 03/27/2022  HCG, Beta Chain, Quant, S     <5 mIU/mL 37 (H)        Physical Exam   Blood pressure 112/65, pulse 88, temperature 98.5 F (36.9 C), temperature source Oral, resp. rate 16, last menstrual period 01/24/2022, SpO2 97 %.  Physical Examination: General appearance - alert, well appearing, and in no distress Mental status - normal mood, behavior, speech, dress, motor activity, and thought processes Eyes - sclera anicteric Chest - normal respiratory effort  Labs: Results for orders placed or performed during the hospital encounter of 03/29/22 (from the past 24 hour(s))  hCG, quantitative, pregnancy   Collection Time: 03/29/22  7:42 AM  Result Value Ref Range   hCG, Beta Chain, Quant, S 12 (H) <5 mIU/mL    Ultrasound Studies:   No results found.  Assessment:   1. Miscarriage   2. [redacted] weeks gestation of pregnancy     -HCG has dropped from 37 down to 12. Has appointment with [facility] later this month (has previously gone to them but more than 10 years ago).  -She is RH positive  Plan: -Discharge home in stable condition -Bleeding/infection precautions discussed -[facility] - will call office to update visit type -Patient may return to MAU as needed or if her condition were to change or worsen  EJorje Guild NP 03/29/2022, 9:12 AM

## 2022-03-29 NOTE — MAU Note (Signed)
Rachel Paul is a 31 y.o. here in MAU reporting: here for follow up hcg. Still having pain and bleeding. Pain is less than previous visit and bleeding is similar to a period.  Onset of complaint: ongoing  Pain score: 1/10  Vitals:   03/29/22 0738  BP: 112/65  Pulse: 88  Resp: 16  Temp: 98.5 F (36.9 C)  SpO2: 97%     Lab orders placed from triage: hcg

## 2022-04-18 ENCOUNTER — Other Ambulatory Visit: Payer: Self-pay | Admitting: Physician Assistant

## 2022-04-19 ENCOUNTER — Other Ambulatory Visit: Payer: Self-pay | Admitting: Physician Assistant

## 2022-04-21 MED ORDER — AMPHETAMINE-DEXTROAMPHET ER 10 MG PO CP24: 10.0000 mg | ORAL_CAPSULE | Freq: Every day | ORAL | 0 refills | Status: DC

## 2022-06-04 ENCOUNTER — Other Ambulatory Visit: Payer: Self-pay | Admitting: Physician Assistant

## 2022-06-05 NOTE — Telephone Encounter (Signed)
Left message on voicemail to call office.  

## 2022-06-12 ENCOUNTER — Other Ambulatory Visit: Payer: Self-pay | Admitting: Physician Assistant

## 2022-06-13 ENCOUNTER — Encounter: Payer: Self-pay | Admitting: Physician Assistant

## 2022-06-13 MED ORDER — AMPHETAMINE-DEXTROAMPHETAMINE 5 MG PO TABS: 5.0000 mg | ORAL_TABLET | Freq: Every day | ORAL | 0 refills | Status: DC

## 2022-06-13 MED ORDER — AMPHETAMINE-DEXTROAMPHET ER 10 MG PO CP24: 10.0000 mg | ORAL_CAPSULE | Freq: Every day | ORAL | 0 refills | Status: DC

## 2022-06-13 NOTE — Telephone Encounter (Signed)
Pt called back, told her Aldona Bar reviewed your chart shows that she is pregnant, however I suspect that she had a miscarriage based on recent visits available in epic. Pt said yes, she had a miscarriage.   Please call and confirm pregnancy status, as well as making sure she understands that this medication is not safe for pregnancy, should she become pregnant again. Pt verbalized understanding and said she is not pregnant now and does understand risks of medication. Pt said as soon as she thought she was pregnant she had stopped the medication.  As long as she is not currently pregnant and understands the risk of this medication, I will refill for her. Pt said she is not pregnant and would like refills of Adderall. Told pt okay, Aldona Bar will send Rx's to the pharmacy. Pt verbalized understanding.

## 2022-06-13 NOTE — Telephone Encounter (Signed)
Rachel Paul, pt called back confirmed she is not pregnant and understands risks of medication and she would like refills.

## 2022-09-09 ENCOUNTER — Other Ambulatory Visit: Payer: Self-pay | Admitting: Physician Assistant

## 2022-09-09 NOTE — Telephone Encounter (Signed)
Pt requesting refills for Adderall XR 10 mg and Adderall 5 mg. Last OV was 02/12/2022

## 2022-09-12 ENCOUNTER — Other Ambulatory Visit: Payer: Self-pay | Admitting: Physician Assistant

## 2022-09-17 ENCOUNTER — Encounter: Payer: Self-pay | Admitting: Physician Assistant

## 2022-09-17 ENCOUNTER — Ambulatory Visit (INDEPENDENT_AMBULATORY_CARE_PROVIDER_SITE_OTHER): Payer: Self-pay | Admitting: Physician Assistant

## 2022-09-17 VITALS — BP 129/70 | HR 89 | Temp 98.4°F | Ht 63.0 in | Wt 196.0 lb

## 2022-09-17 DIAGNOSIS — F909 Attention-deficit hyperactivity disorder, unspecified type: Secondary | ICD-10-CM

## 2022-09-17 DIAGNOSIS — A609 Anogenital herpesviral infection, unspecified: Secondary | ICD-10-CM

## 2022-09-17 MED ORDER — VALACYCLOVIR HCL 500 MG PO TABS
500.0000 mg | ORAL_TABLET | Freq: Every day | ORAL | 1 refills | Status: DC
Start: 2022-09-17 — End: 2022-12-04

## 2022-09-17 MED ORDER — AMPHETAMINE-DEXTROAMPHET ER 10 MG PO CP24: 10.0000 mg | ORAL_CAPSULE | ORAL | 0 refills | Status: DC

## 2022-09-17 MED ORDER — AMPHETAMINE-DEXTROAMPHETAMINE 5 MG PO TABS: 5.0000 mg | ORAL_TABLET | Freq: Every day | ORAL | 0 refills | Status: DC

## 2022-09-17 NOTE — Progress Notes (Signed)
Rachel Paul is a 31 y.o. female here for a follow up of a pre-existing problem.  History of Present Illness:   Chief Complaint  Patient presents with   Medication Refill    ADHD med refill    HPI  ADHD She is also requesting a refill on her 5 mg and 10 mg Adderall XR. She is tolerating well Denies concerns with this medication Denies insomnia or worsening anxiety  HSV Patient is requesting a refill on her 500 mg valtrex medication. She states that she only uses it for outbreaks which happen about 2-3 times a month.   Past Medical History:  Diagnosis Date   Allergy    Anxiety    Asthma    Depression    Dysmenorrhea    Pyelonephritis      Social History   Tobacco Use   Smoking status: Some Days    Packs/day: 0.00    Years: 10.00    Total pack years: 0.00    Types: Cigarettes   Smokeless tobacco: Never   Tobacco comments:    Very irregular smoking cigarettes  Vaping Use   Vaping Use: Never used  Substance Use Topics   Alcohol use: Yes    Alcohol/week: 1.0 standard drink of alcohol    Types: 1 Cans of beer per week    Comment: Rarely drink, once per month   Drug use: Not Currently    Types: Marijuana    Comment: 03/15/22    Past Surgical History:  Procedure Laterality Date   WISDOM TOOTH EXTRACTION      Family History  Problem Relation Age of Onset   Thyroid disease Mother    Asthma Mother    Sleep apnea Mother    ADD / ADHD Mother    Miscarriages / Stillbirths Mother    Obesity Mother    Skin cancer Father    Hearing loss Father    Asthma Brother    ADD / ADHD Brother    Sleep apnea Brother    ADD / ADHD Brother    Depression Brother    Obesity Brother    Ovarian cancer Maternal Grandmother    Diabetes Maternal Grandmother    Breast cancer Maternal Grandmother        ?   Obesity Maternal Grandmother    Stroke Maternal Grandmother    Diabetes Maternal Grandfather    Alcohol abuse Maternal Grandfather    Early death Maternal Grandfather     Obesity Maternal Grandfather    Anxiety disorder Paternal Grandmother    Diabetes Paternal Grandfather    Prostate cancer Paternal Grandfather    Hearing loss Paternal Grandfather    Colon cancer Neg Hx     Allergies  Allergen Reactions   Amoxicillin Hives    Other reaction(s): Unknown   Azithromycin Itching and Swelling    Other reaction(s): Unknown   Levaquin [Levofloxacin In D5w] Hives   Levofloxacin     Other reaction(s): Unknown    Current Medications:   Current Outpatient Medications:    amphetamine-dextroamphetamine (ADDERALL XR) 10 MG 24 hr capsule, Take 1 capsule (10 mg total) by mouth every morning., Disp: 30 capsule, Rfl: 0   [START ON 10/17/2022] amphetamine-dextroamphetamine (ADDERALL XR) 10 MG 24 hr capsule, Take 1 capsule (10 mg total) by mouth every morning., Disp: 30 capsule, Rfl: 0   [START ON 11/16/2022] amphetamine-dextroamphetamine (ADDERALL XR) 10 MG 24 hr capsule, Take 1 capsule (10 mg total) by mouth every morning., Disp: 30 capsule, Rfl:  0   amphetamine-dextroamphetamine (ADDERALL) 5 MG tablet, Take 1 tablet (5 mg total) by mouth daily before breakfast., Disp: 30 tablet, Rfl: 0   [START ON 10/17/2022] amphetamine-dextroamphetamine (ADDERALL) 5 MG tablet, Take 1 tablet (5 mg total) by mouth daily before breakfast., Disp: 30 tablet, Rfl: 0   [START ON 11/16/2022] amphetamine-dextroamphetamine (ADDERALL) 5 MG tablet, Take 1 tablet (5 mg total) by mouth daily before breakfast., Disp: 30 tablet, Rfl: 0   alclomethasone (ACLOVATE) 0.05 % cream, Apply to affected area every morning, Disp: 180 g, Rfl: 6   amphetamine-dextroamphetamine (ADDERALL XR) 10 MG 24 hr capsule, Take 1 capsule (10 mg total) by mouth daily., Disp: 30 capsule, Rfl: 0   amphetamine-dextroamphetamine (ADDERALL) 5 MG tablet, Take 1 tablet (5 mg total) by mouth daily., Disp: 30 tablet, Rfl: 0   ketoconazole (NIZORAL) 2 % shampoo, Apply to affected area- let sit 3-5 minutes before rinsing, Disp: 120  mL, Rfl: 8   LORazepam (ATIVAN) 0.5 MG tablet, TAKE ONE TABLET BY MOUTH DAILY AS NEEDED FOR ANXIETY, Disp: 30 tablet, Rfl: 0   propranolol (INDERAL) 20 MG tablet, Take 1 tablet (20 mg total) by mouth 3 (three) times daily as needed., Disp: 90 tablet, Rfl: 1   valACYclovir (VALTREX) 500 MG tablet, Take 1 tablet (500 mg total) by mouth daily., Disp: 30 tablet, Rfl: 1   Review of Systems:   ROS Negative unless otherwise specified per HPI.   Vitals:   Vitals:   09/17/22 1535  BP: 129/70  Pulse: 89  Temp: 98.4 F (36.9 C)  TempSrc: Other (Comment)  SpO2: 99%  Weight: 196 lb (88.9 kg)  Height: '5\' 3"'$  (1.6 m)     Body mass index is 34.72 kg/m.  Physical Exam:   Physical Exam Vitals and nursing note reviewed.  Constitutional:      General: She is not in acute distress.    Appearance: Normal appearance. She is well-developed. She is not ill-appearing or toxic-appearing.  HENT:     Head: Normocephalic and atraumatic.     Right Ear: External ear normal.     Left Ear: External ear normal.  Eyes:     Extraocular Movements: Extraocular movements intact.     Pupils: Pupils are equal, round, and reactive to light.  Cardiovascular:     Rate and Rhythm: Normal rate and regular rhythm.     Pulses: Normal pulses.     Heart sounds: Normal heart sounds, S1 normal and S2 normal. No murmur heard.    No gallop.  Pulmonary:     Effort: Pulmonary effort is normal. No respiratory distress.     Breath sounds: Normal breath sounds. No wheezing or rales.  Skin:    General: Skin is warm and dry.  Neurological:     Mental Status: She is alert and oriented to person, place, and time.     GCS: GCS eye subscore is 4. GCS verbal subscore is 5. GCS motor subscore is 6.  Psychiatric:        Speech: Speech normal.        Behavior: Behavior normal. Behavior is cooperative.        Judgment: Judgment normal.     Assessment and Plan:   Attention deficit hyperactivity disorder (ADHD), unspecified  ADHD type Well controlled PDMP reviewed - no red flags Continue Adderall XR 10 mg daily and Adderall 5 mg daily Follow-up in 6 months, sooner if concerns  HSV (herpes simplex virus) anogenital infection Well controlled Continue valtrex 500 mg  prn Follow-up as needed  I,Verona Buck,acting as a scribe for [employer], PA.,have documented all relevant documentation on the behalf of [name], PA,as directed by  [name], PA while in the presence of [name], [place].  I, [name], [place], have reviewed all documentation for this visit. The documentation on 09/17/22 for the exam, diagnosis, procedures, and orders are all accurate and complete.  [name], PA-C

## 2022-10-31 ENCOUNTER — Other Ambulatory Visit: Payer: Self-pay | Admitting: Physician Assistant

## 2022-10-31 MED ORDER — AMPHETAMINE-DEXTROAMPHET ER 10 MG PO CP24: 10.0000 mg | ORAL_CAPSULE | ORAL | 0 refills | Status: DC

## 2022-12-04 ENCOUNTER — Telehealth: Payer: Self-pay | Admitting: Physician Assistant

## 2022-12-04 MED ORDER — VALACYCLOVIR HCL 500 MG PO TABS: 500.0000 mg | ORAL_TABLET | Freq: Every day | ORAL | 1 refills | Status: DC

## 2022-12-04 NOTE — Telephone Encounter (Signed)
Left message on voicemail Rx sent to pharmacy as requested. 

## 2022-12-04 NOTE — Telephone Encounter (Signed)
  Encourage patient to contact the pharmacy for refills or they can request refills through [place]:  Please schedule appointment if longer than 1 year  NEXT APPOINTMENT DATE:  MEDICATION:  valACYclovir (VALTREX) 500 MG tablet   Is the patient out of medication? Yes  [facility] [id number] [place], [place] Phone: [phone]  Fax: [phone]      Let patient know to contact pharmacy at the end of the day to make sure medication is ready.  Please notify patient to allow 48-72 hours to process

## 2022-12-25 ENCOUNTER — Other Ambulatory Visit: Payer: Self-pay | Admitting: Physician Assistant

## 2022-12-25 MED ORDER — AMPHETAMINE-DEXTROAMPHETAMINE 5 MG PO TABS: 5.0000 mg | ORAL_TABLET | Freq: Every day | ORAL | 0 refills | Status: DC

## 2022-12-25 MED ORDER — AMPHETAMINE-DEXTROAMPHET ER 10 MG PO CP24: 10.0000 mg | ORAL_CAPSULE | ORAL | 0 refills | Status: DC

## 2023-02-17 ENCOUNTER — Other Ambulatory Visit: Payer: Self-pay | Admitting: Physician Assistant

## 2023-02-18 MED ORDER — AMPHETAMINE-DEXTROAMPHETAMINE 5 MG PO TABS: 5.0000 mg | ORAL_TABLET | Freq: Every day | ORAL | 0 refills | Status: DC

## 2023-02-18 MED ORDER — AMPHETAMINE-DEXTROAMPHET ER 10 MG PO CP24: 10.0000 mg | ORAL_CAPSULE | ORAL | 0 refills | Status: DC

## 2023-02-18 NOTE — Telephone Encounter (Signed)
10 mg: 12/25/22 #30, 0 5 mg: 12/25/22 #30,0 Last OV: 09/17/22 dx. ADHD

## 2023-03-20 ENCOUNTER — Other Ambulatory Visit: Payer: Self-pay | Admitting: Advanced Practice Midwife

## 2023-03-20 DIAGNOSIS — R102 Pelvic and perineal pain: Secondary | ICD-10-CM

## 2023-04-07 ENCOUNTER — Other Ambulatory Visit: Payer: Self-pay | Admitting: Physician Assistant

## 2023-04-07 MED ORDER — AMPHETAMINE-DEXTROAMPHETAMINE 5 MG PO TABS: 5.0000 mg | ORAL_TABLET | Freq: Every day | ORAL | 0 refills | Status: DC

## 2023-04-07 MED ORDER — AMPHETAMINE-DEXTROAMPHET ER 10 MG PO CP24: 10.0000 mg | ORAL_CAPSULE | ORAL | 0 refills | Status: DC

## 2023-04-07 NOTE — Telephone Encounter (Signed)
Last OV: 09/17/22  Next OV: 04/14/23  Last filled: 02/18/23  Quantity: 30 caps for 5 mg and 10 mg

## 2023-04-14 ENCOUNTER — Ambulatory Visit (INDEPENDENT_AMBULATORY_CARE_PROVIDER_SITE_OTHER): Payer: Self-pay | Admitting: Physician Assistant

## 2023-04-14 VITALS — BP 124/70 | HR 76 | Temp 97.6°F | Ht 63.0 in | Wt 196.5 lb

## 2023-04-14 DIAGNOSIS — R21 Rash and other nonspecific skin eruption: Secondary | ICD-10-CM

## 2023-04-14 DIAGNOSIS — L409 Psoriasis, unspecified: Secondary | ICD-10-CM

## 2023-04-14 DIAGNOSIS — F418 Other specified anxiety disorders: Secondary | ICD-10-CM

## 2023-04-14 MED ORDER — CLOBETASOL PROPIONATE 0.05 % EX SOLN: CUTANEOUS | 0 refills | Status: DC

## 2023-04-14 NOTE — Progress Notes (Signed)
Rachel Paul is a 32 y.o. female here for a new problem.  History of Present Illness:   Chief Complaint  Patient presents with   Rash    Pt notes small bumps, itching, flaking scalp, 1 month ago started, notes tried changing diet, changing detergents     HPI  Itchy scalp:  She complains of itching and flaky scalp for 2 years.  She states she has many open wounds on her scalp. She visited the dermatologist about a year ago and has tried the shampoo that was recommended (ketoconazole), which she states did not help.   Small bumps on skin: She also endorses small bumps on her forearms, and swollen glands. She is unsure of the cause but notes she has been stressed. Has hx of stress induced facial rash for which she followed up with rheumatology. She has tried changing her diet and detergents, moisturizing daily, and applying coconut oil at nighttime.  Situational anxiety Husband is going through some health issues She is having situational stress trying to care for him and find care that he needs Denies SI/HI  Past Medical History:  Diagnosis Date   Allergy    Anxiety    Asthma    Depression    Dysmenorrhea    Pyelonephritis      Social History   Tobacco Use   Smoking status: Some Days    Packs/day: 0.00    Years: 10.00    Additional pack years: 0.00    Total pack years: 0.00    Types: Cigarettes   Smokeless tobacco: Never   Tobacco comments:    Very irregular smoking cigarettes  Vaping Use   Vaping Use: Never used  Substance Use Topics   Alcohol use: Yes    Alcohol/week: 1.0 standard drink of alcohol    Types: 1 Cans of beer per week    Comment: Rarely drink, once per month   Drug use: Not Currently    Types: Marijuana    Comment: 03/15/22    Past Surgical History:  Procedure Laterality Date   WISDOM TOOTH EXTRACTION      Family History  Problem Relation Age of Onset   Thyroid disease Mother    Asthma Mother    Sleep apnea Mother    ADD / ADHD  Mother    Miscarriages / Stillbirths Mother    Obesity Mother    Skin cancer Father    Hearing loss Father    Asthma Brother    ADD / ADHD Brother    Sleep apnea Brother    ADD / ADHD Brother    Depression Brother    Obesity Brother    Ovarian cancer Maternal Grandmother    Diabetes Maternal Grandmother    Breast cancer Maternal Grandmother        ?   Obesity Maternal Grandmother    Stroke Maternal Grandmother    Diabetes Maternal Grandfather    Alcohol abuse Maternal Grandfather    Early death Maternal Grandfather    Obesity Maternal Grandfather    Anxiety disorder Paternal Grandmother    Diabetes Paternal Grandfather    Prostate cancer Paternal Grandfather    Hearing loss Paternal Grandfather    Colon cancer Neg Hx     Allergies  Allergen Reactions   Amoxicillin Hives    Other reaction(s): Unknown   Azithromycin Itching and Swelling    Other reaction(s): Unknown   Levaquin [Levofloxacin In D5w] Hives   Levofloxacin     Other reaction(s): Unknown  Current Medications:   Current Outpatient Medications:    alclomethasone (ACLOVATE) 0.05 % cream, Apply to affected area every morning, Disp: 180 g, Rfl: 6   amphetamine-dextroamphetamine (ADDERALL XR) 10 MG 24 hr capsule, Take 1 capsule (10 mg total) by mouth every morning., Disp: 30 capsule, Rfl: 0   amphetamine-dextroamphetamine (ADDERALL) 5 MG tablet, Take 1 tablet (5 mg total) by mouth daily before breakfast., Disp: 30 tablet, Rfl: 0   ketoconazole (NIZORAL) 2 % shampoo, Apply to affected area- let sit 3-5 minutes before rinsing, Disp: 120 mL, Rfl: 8   LORazepam (ATIVAN) 0.5 MG tablet, TAKE ONE TABLET BY MOUTH DAILY AS NEEDED FOR ANXIETY, Disp: 30 tablet, Rfl: 0   propranolol (INDERAL) 20 MG tablet, Take 1 tablet (20 mg total) by mouth 3 (three) times daily as needed., Disp: 90 tablet, Rfl: 1   valACYclovir (VALTREX) 500 MG tablet, Take 1 tablet (500 mg total) by mouth daily., Disp: 90 tablet, Rfl: 1   Review of  Systems:   Review of Systems  Skin:  Positive for itching (itching/flaky scalp --see HPI).    Vitals:   Vitals:   04/14/23 0930  BP: 124/70  Pulse: 76  Temp: 97.6 F (36.4 C)  TempSrc: Temporal  SpO2: 100%  Weight: 196 lb 8 oz (89.1 kg)  Height: 5\' 3"  (1.6 m)     Body mass index is 34.81 kg/m.  Physical Exam:   Physical Exam Vitals and nursing note reviewed.  Constitutional:      General: She is not in acute distress.    Appearance: She is well-developed. She is not ill-appearing or toxic-appearing.  Cardiovascular:     Rate and Rhythm: Normal rate and regular rhythm.     Pulses: Normal pulses.     Heart sounds: Normal heart sounds, S1 normal and S2 normal.  Pulmonary:     Effort: Pulmonary effort is normal.     Breath sounds: Normal breath sounds.  Skin:    General: Skin is warm and dry.     Comments: Significant silvery plaques to b/l post-auricular area with multiple excoriations  Right arm with numerous scattered flesh-colored papules  Neurological:     Mental Status: She is alert.     GCS: GCS eye subscore is 4. GCS verbal subscore is 5. GCS motor subscore is 6.  Psychiatric:        Speech: Speech normal.        Behavior: Behavior normal. Behavior is cooperative.     Assessment and Plan:   Psoriasis Referral to dermatology Trial topical clobetasol to area  Rash and nonspecific skin eruption Unclear etiology Will update blood work Recommend stress reduction -- declines medication intervention (see below) Trial oral antihistamine Derm referral  Situational anxiety Uncontrolled Declines prescription Continue to monitor I discussed with patient that if they develop any SI, to tell someone immediately and seek medical attention.   I,Rachel Rivera,acting as a Neurosurgeon for [employer], PA.,have documented all relevant documentation on the behalf of Jarold Motto, PA,as directed by  Jarold Motto, PA while in the presence of Jarold Motto,  [place].  I, Jarold Motto, [place], have reviewed all documentation for this visit. The documentation on 04/14/23 for the exam, diagnosis, procedures, and orders are all accurate and complete.   Jarold Motto, PA-C

## 2023-04-14 NOTE — Patient Instructions (Signed)
It was great to see you!  Use the clobetasol topical solution to scalp once daily We will update blood work today  Please start daily OTC (available over the counter without a prescription) antihistamine  Referral to [facility] [address] [place],  [place]  [address] Main: [phone]  Take care,  Jarold Motto PA-C

## 2023-06-16 ENCOUNTER — Other Ambulatory Visit: Payer: Self-pay | Admitting: Physician Assistant

## 2023-06-16 MED ORDER — AMPHETAMINE-DEXTROAMPHET ER 10 MG PO CP24: 10.0000 mg | ORAL_CAPSULE | ORAL | 0 refills | Status: DC

## 2023-06-16 MED ORDER — AMPHETAMINE-DEXTROAMPHETAMINE 5 MG PO TABS: 5.0000 mg | ORAL_TABLET | Freq: Every day | ORAL | 0 refills | Status: DC

## 2023-06-16 MED ORDER — LORAZEPAM 0.5 MG PO TABS: 0.5000 mg | ORAL_TABLET | Freq: Three times a day (TID) | ORAL | 1 refills | Status: DC | PRN

## 2023-06-16 NOTE — Telephone Encounter (Signed)
Pt requesting refills for Adderall XR 10 mg, Adderall 5 mg, Lorazepam 0.5 mg. Last OV 04/14/2023.

## 2023-08-18 ENCOUNTER — Other Ambulatory Visit: Payer: Self-pay | Admitting: Physician Assistant

## 2023-08-18 ENCOUNTER — Encounter: Payer: Self-pay | Admitting: Physician Assistant

## 2023-08-18 MED ORDER — VALACYCLOVIR HCL 500 MG PO TABS: 500.0000 mg | ORAL_TABLET | Freq: Every day | ORAL | 1 refills | Status: DC

## 2023-08-19 MED ORDER — AMPHETAMINE-DEXTROAMPHETAMINE 5 MG PO TABS: 5.0000 mg | ORAL_TABLET | Freq: Every day | ORAL | 0 refills | Status: DC

## 2023-08-19 MED ORDER — AMPHETAMINE-DEXTROAMPHET ER 10 MG PO CP24: 10.0000 mg | ORAL_CAPSULE | ORAL | 0 refills | Status: DC

## 2023-11-10 ENCOUNTER — Ambulatory Visit: Payer: Self-pay | Admitting: Physician Assistant

## 2023-11-17 ENCOUNTER — Ambulatory Visit: Payer: 59 | Admitting: Physician Assistant

## 2024-01-05 ENCOUNTER — Other Ambulatory Visit: Payer: Self-pay | Admitting: Physician Assistant

## 2024-01-05 NOTE — Telephone Encounter (Signed)
 Pt requesting refill for Adderall XR 10 mg and Adderall 5 mg. Last OV 04/14/2023. Pt scheduled 01/12/2024.

## 2024-01-12 ENCOUNTER — Ambulatory Visit: Admitting: Physician Assistant

## 2024-01-12 VITALS — BP 110/70 | HR 79 | Temp 97.2°F | Ht 63.0 in | Wt 205.5 lb

## 2024-01-12 DIAGNOSIS — E559 Vitamin D deficiency, unspecified: Secondary | ICD-10-CM

## 2024-01-12 DIAGNOSIS — R21 Rash and other nonspecific skin eruption: Secondary | ICD-10-CM

## 2024-01-12 DIAGNOSIS — E669 Obesity, unspecified: Secondary | ICD-10-CM

## 2024-01-12 DIAGNOSIS — Z0000 Encounter for general adult medical examination without abnormal findings: Secondary | ICD-10-CM | POA: Diagnosis not present

## 2024-01-12 DIAGNOSIS — Z1159 Encounter for screening for other viral diseases: Secondary | ICD-10-CM

## 2024-01-12 DIAGNOSIS — F418 Other specified anxiety disorders: Secondary | ICD-10-CM | POA: Diagnosis not present

## 2024-01-12 DIAGNOSIS — Z720 Tobacco use: Secondary | ICD-10-CM

## 2024-01-12 DIAGNOSIS — Z789 Other specified health status: Secondary | ICD-10-CM

## 2024-01-12 DIAGNOSIS — F909 Attention-deficit hyperactivity disorder, unspecified type: Secondary | ICD-10-CM

## 2024-01-12 DIAGNOSIS — R768 Other specified abnormal immunological findings in serum: Secondary | ICD-10-CM

## 2024-01-12 DIAGNOSIS — Z23 Encounter for immunization: Secondary | ICD-10-CM

## 2024-01-12 DIAGNOSIS — A609 Anogenital herpesviral infection, unspecified: Secondary | ICD-10-CM

## 2024-01-12 DIAGNOSIS — R202 Paresthesia of skin: Secondary | ICD-10-CM

## 2024-01-12 DIAGNOSIS — Z124 Encounter for screening for malignant neoplasm of cervix: Secondary | ICD-10-CM

## 2024-01-12 LAB — LIPID PANEL
Cholesterol: 161 mg/dL (ref 0–200)
HDL: 42.3 mg/dL (ref >39.00)
LDL Cholesterol: 94 mg/dL (ref 0–99)
NonHDL: 118.55
Total CHOL/HDL Ratio: 4
Triglycerides: 122 mg/dL (ref 0.0–149.0)
VLDL: 24.4 mg/dL (ref 0.0–40.0)

## 2024-01-12 LAB — COMPREHENSIVE METABOLIC PANEL
ALT: 39 U/L — ABNORMAL HIGH (ref 0–35)
AST: 64 U/L — ABNORMAL HIGH (ref 0–37)
Albumin: 4.6 g/dL (ref 3.5–5.2)
Alkaline Phosphatase: 52 U/L (ref 39–117)
BUN: 15 mg/dL (ref 6–23)
CO2: 27 meq/L (ref 19–32)
Calcium: 9 mg/dL (ref 8.4–10.5)
Chloride: 104 meq/L (ref 96–112)
Creatinine, Ser: 0.6 mg/dL (ref 0.40–1.20)
GFR: 117.24 mL/min (ref >60.00)
Glucose, Bld: 91 mg/dL (ref 70–99)
Potassium: 3.7 meq/L (ref 3.5–5.1)
Sodium: 139 meq/L (ref 135–145)
Total Bilirubin: 0.5 mg/dL (ref 0.2–1.2)
Total Protein: 7.1 g/dL (ref 6.0–8.3)

## 2024-01-12 LAB — CBC WITH DIFFERENTIAL/PLATELET
Basophils Absolute: 0 10*3/uL (ref 0.0–0.1)
Basophils Relative: 0.6 % (ref 0.0–3.0)
Eosinophils Absolute: 0.1 10*3/uL (ref 0.0–0.7)
Eosinophils Relative: 1.5 % (ref 0.0–5.0)
HCT: 38.5 % (ref 36.0–46.0)
Hemoglobin: 13.1 g/dL (ref 12.0–15.0)
Lymphocytes Relative: 35.4 % (ref 12.0–46.0)
Lymphs Abs: 2.4 10*3/uL (ref 0.7–4.0)
MCHC: 34.1 g/dL (ref 30.0–36.0)
MCV: 93.7 fl (ref 78.0–100.0)
Monocytes Absolute: 0.3 10*3/uL (ref 0.1–1.0)
Monocytes Relative: 4.8 % (ref 3.0–12.0)
Neutro Abs: 3.9 10*3/uL (ref 1.4–7.7)
Neutrophils Relative %: 57.7 % (ref 43.0–77.0)
Platelets: 258 10*3/uL (ref 150.0–400.0)
RBC: 4.11 Mil/uL (ref 3.87–5.11)
RDW: 13 % (ref 11.5–15.5)
WBC: 6.7 10*3/uL (ref 4.0–10.5)

## 2024-01-12 LAB — IBC + FERRITIN
Ferritin: 58.9 ng/mL (ref 10.0–291.0)
Iron: 82 ug/dL (ref 42–145)
Saturation Ratios: 22.3 % (ref 20.0–50.0)
TIBC: 368.2 ug/dL (ref 250.0–450.0)
Transferrin: 263 mg/dL (ref 212.0–360.0)

## 2024-01-12 LAB — VITAMIN B12: Vitamin B-12: 570 pg/mL (ref 211–911)

## 2024-01-12 LAB — TSH: TSH: 2.48 u[IU]/mL (ref 0.35–5.50)

## 2024-01-12 LAB — VITAMIN D 25 HYDROXY (VIT D DEFICIENCY, FRACTURES): VITD: 13.64 ng/mL — ABNORMAL LOW (ref 30.00–100.00)

## 2024-01-12 MED ORDER — AMPHETAMINE-DEXTROAMPHET ER 10 MG PO CP24: 10.0000 mg | ORAL_CAPSULE | ORAL | 0 refills | Status: DC

## 2024-01-12 MED ORDER — AMPHETAMINE-DEXTROAMPHETAMINE 5 MG PO TABS: 5.0000 mg | ORAL_TABLET | Freq: Every day | ORAL | 0 refills | Status: DC

## 2024-01-12 MED ORDER — ZEPBOUND 2.5 MG/0.5ML ~~LOC~~ SOAJ: 2.5000 mg | SUBCUTANEOUS | 0 refills | Status: DC

## 2024-01-12 MED ORDER — VALACYCLOVIR HCL 500 MG PO TABS: 500.0000 mg | ORAL_TABLET | Freq: Every day | ORAL | 1 refills | Status: DC

## 2024-01-12 NOTE — Patient Instructions (Signed)
 It was great to see you!  Please go to the lab for blood work.   Our office will call you with your results unless you have chosen to receive results via MyChart.  If your blood work is normal we will follow-up each year for physicals and as scheduled for chronic medical problems.  If anything is abnormal we will treat accordingly and get you in for a follow-up.  Take care,  Lelon Mast

## 2024-01-12 NOTE — Progress Notes (Signed)
 Subjective:    Rachel Paul is a 33 y.o. female and is here for a comprehensive physical exam.  HPI  Health Maintenance Due  Topic Date Due   Pneumococcal Vaccine 50-46 Years old (1 of 2 - PCV) Never done   Hepatitis C Screening  Never done   Cervical Cancer Screening (HPV/Pap Cotest)  03/12/2020   DTaP/Tdap/Td (2 - Td or Tdap) 04/21/2022    Acute Concerns: Tingling in legs She reports a tingling in her legs, starting about 1 year ago. Describes it as pins and needles and a "weird sensation". Reports currently experiencing this sensation in her knees.   Chronic Issues: Weight management Has been struggling to lose weight despite her best efforts with healthy diet and regular exercise.  Exercises daily.  Has vegan/vegetarian diet.  Reports some comfort eating and binge eating on occasion.  States her Adderall has helped with her emotional eating.  Has hx of eating disorder; anorexia with purging and strict restrictions.   Anxiety / Depression Pt is on Ativan 0.5 mg as needed for anxiety.  Only has to take it about once a week.  Tolerating well.  Has not been taking propanolol.  ADHD // Anxiety/Depression Pt is on Adderall 10 mg XR once daily and Adderall 5 mg once daily. If she does not take her Adderall, it makes it difficult to focus on reading.  States she doesn't notice the clinical effect of the 5 mg as much.  Reports she usually has difficulty falling asleep when taking her meds.   Rashes / Skin issues Pt reports rashes with associated pain and itchiness on her scalp, mid-lower abdomen, left axilla, and right elbow. Rash on her abdomen has not changed in size.  States eating processed sugars such as too much juice, she will have a flare up.  Has been seen by dermatology in the past and was given a shampoo and "liquid" for her scalp.  Has not been formally diagnosed with any skin disorders by derm.  She has also been given oral steroids from [facility].   States steroids were the only thing that helped, but still did not fully resolve symptoms.   Health Maintenance: Immunizations -- UTD. Colonoscopy -- N/A Mammogram -- N/A PAP -- Overdue since 02/2020. Last done 03/12/17. Results were NILM.  Bone Density -- N/A Diet -- Vegan/vegetarian diet. Healthy eating. Occasional comfort eat and binge eat.  Exercise -- Exercising daily.   Sleep habits -- Some difficulty sleeping when taking Adderall. See above. Mood -- Stable.   UTD with dentist? - Yes. UTD with eye doctor? - No. Wears glasses but doesn't use them.   Weight history: Wt Readings from Last 10 Encounters:  01/12/24 205 lb 8 oz (93.2 kg)  04/14/23 196 lb 8 oz (89.1 kg)  09/17/22 196 lb (88.9 kg)  03/27/22 189 lb 8 oz (86 kg)  02/12/22 187 lb (84.8 kg)  08/14/21 192 lb (87.1 kg)  12/25/20 185 lb (83.9 kg)  10/16/20 196 lb (88.9 kg)  08/13/20 202 lb (91.6 kg)  04/06/20 207 lb (93.9 kg)   Body mass index is 36.4 kg/m. Patient's last menstrual period was 12/25/2023 (exact date).  Alcohol use:  reports current alcohol use of about 1.0 standard drink of alcohol per week.  Tobacco use:  Tobacco Use: High Risk (01/12/2024)   Patient History    Smoking Tobacco Use: Some Days    Smokeless Tobacco Use: Never    Passive Exposure: Not on file  Eligible for lung cancer screening? no     01/12/2024    8:50 AM  Depression screen PHQ 2/9  Decreased Interest 1  Down, Depressed, Hopeless 1  PHQ - 2 Score 2  Altered sleeping 2  Tired, decreased energy 2  Change in appetite 2  Feeling bad or failure about yourself  1  Trouble concentrating 2  Moving slowly or fidgety/restless 1  Suicidal thoughts 1  PHQ-9 Score 13  Difficult doing work/chores Somewhat difficult     Other providers/specialists: Patient Care Team: Jarold Motto, [place] as PCP - General (Physician Assistant)    PMHx, SurgHx, SocialHx, Medications, and Allergies were reviewed in the Visit Navigator and  updated as appropriate.   Past Medical History:  Diagnosis Date   Allergy    Anxiety    Asthma    Depression    Dysmenorrhea    Pyelonephritis      Past Surgical History:  Procedure Laterality Date   WISDOM TOOTH EXTRACTION       Family History  Problem Relation Age of Onset   Thyroid disease Mother    Asthma Mother    Sleep apnea Mother    ADD / ADHD Mother    Miscarriages / Stillbirths Mother    Obesity Mother    Skin cancer Father    Hearing loss Father    Asthma Brother    ADD / ADHD Brother    Sleep apnea Brother    ADD / ADHD Brother    Depression Brother    Obesity Brother    Ovarian cancer Maternal Grandmother    Diabetes Maternal Grandmother    Breast cancer Maternal Grandmother        ?   Obesity Maternal Grandmother    Stroke Maternal Grandmother    Diabetes Maternal Grandfather    Alcohol abuse Maternal Grandfather    Early death Maternal Grandfather    Obesity Maternal Grandfather    Anxiety disorder Paternal Grandmother    Diabetes Paternal Grandfather    Prostate cancer Paternal Grandfather    Hearing loss Paternal Grandfather    Colon cancer Neg Hx     Social History   Tobacco Use   Smoking status: Some Days    Current packs/day: 0.00    Types: Cigarettes   Smokeless tobacco: Never   Tobacco comments:    Very irregular smoking cigarettes  Vaping Use   Vaping status: Never Used  Substance Use Topics   Alcohol use: Yes    Alcohol/week: 1.0 standard drink of alcohol    Types: 1 Cans of beer per week    Comment: Rarely drink, once per month   Drug use: Not Currently    Types: Marijuana    Comment: 03/15/22    Review of Systems:   Review of Systems  Constitutional:  Negative for chills, fever, malaise/fatigue and weight loss.  HENT:  Negative for hearing loss, sinus pain and sore throat.   Respiratory:  Negative for cough and hemoptysis.   Cardiovascular:  Negative for chest pain, palpitations, leg swelling and PND.   Gastrointestinal:  Negative for abdominal pain, constipation, diarrhea, heartburn, nausea and vomiting.  Genitourinary:  Negative for dysuria, frequency and urgency.  Musculoskeletal:  Negative for back pain, myalgias and neck pain.  Skin:  Negative for itching and rash.  Neurological:  Negative for dizziness, tingling, seizures and headaches.  Endo/Heme/Allergies:  Negative for polydipsia.  Psychiatric/Behavioral:  Negative for depression. The patient is not nervous/anxious.  Objective:   BP 110/70 (BP Location: Left Arm, Patient Position: Sitting, Cuff Size: Large)   Pulse 79   Temp (!) 97.2 F (36.2 C) (Temporal)   Ht 5\' 3"  (1.6 m)   Wt 205 lb 8 oz (93.2 kg)   LMP 12/25/2023 (Exact Date)   SpO2 98%   BMI 36.40 kg/m  Body mass index is 36.4 kg/m.   General Appearance:    Alert, cooperative, no distress, appears stated age  Head:    Normocephalic, without obvious abnormality, atraumatic  Eyes:    PERRL, conjunctiva/corneas clear, EOM's intact, fundi    benign, both eyes  Ears:    Normal TM's and external ear canals, both ears  Nose:   Nares normal, septum midline, mucosa normal, no drainage    or sinus tenderness  Throat:   Lips, mucosa, and tongue normal; teeth and gums normal  Neck:   Supple, symmetrical, trachea midline, no adenopathy;    thyroid:  no enlargement/tenderness/nodules; no carotid   bruit or JVD  Back:     Symmetric, no curvature, ROM normal, no CVA tenderness  Lungs:     Clear to auscultation bilaterally, respirations unlabored  Chest Wall:    No tenderness or deformity   Heart:    Regular rate and rhythm, S1 and S2 normal, no murmur, rub or gallop  Breast Exam:    Deferred  Abdomen:     Soft, non-tender, bowel sounds active all four quadrants,    no masses, no organomegaly  Genitalia:    Deferred  Extremities:   Extremities normal, atraumatic, no cyanosis or edema  Pulses:   2+ and symmetric all extremities  Skin:   Skin color, texture, turgor  normal Erythematous flaky rash to posterior aspect of bilateral ears, middle abdominal area with well-demarcated erythematous plaque to middle abdomen  Lymph nodes:   Cervical, supraclavicular, and axillary nodes normal  Neurologic:   CNII-XII intact, normal strength, sensation and reflexes    throughout    Assessment/Plan:   Routine physical examination Today patient counseled on age appropriate routine health concerns for screening and prevention, each reviewed and up to date or declined. Immunizations reviewed and up to date or declined. Labs ordered and reviewed. Risk factors for depression reviewed and negative. Hearing function and visual acuity are intact. ADLs screened and addressed as needed. Functional ability and level of safety reviewed and appropriate. Education, counseling and referrals performed based on assessed risks today. Patient provided with a copy of personalized plan for preventive services.  Obesity, unspecified class, unspecified obesity type, unspecified whether serious comorbidity present Update blood work and provide recommendations Discussed risks and benefits/side effect(s) of glp-1 Discussed that this is not safe for pregnancy -- recommend use of contraceptives while taking Will trial Zepbound 2.5 mg weekly Follow up in 3 month(s), sooner if concerns Consider change to Vyvanse for Attention Deficit Hyperactivity Disorder (ADHD) management and food noise if this medication is not covered  Rash and nonspecific skin eruption Has close follow up with dermatology in next week or so -- would like to hold off on new scripts at this time  Situational anxiety Overall stable Continue as needed ativan 0.5 mg daily Follow up in 6 month(s), sooner if concerns  Attention deficit hyperactivity disorder (ADHD), unspecified ADHD type Well controlled overall Continue Adderall XR 10 mg and Adderall 5 mg  Follow up in 3 month(s), sooner if concerns PDMP reviewed during this  encounter.  HSV (herpes simplex virus) anogenital infection Overall controlled Continue  valtrex 500 mg daily  Pap smear for cervical cancer screening Referral to gynecology   Vegan; Paresthesias Update B12 and TSH Continue to monitor symptom(s) No red flags Neurology exam wnl Recommend close follow up if symptom(s) worsen or change, consider neurology referral if persists  Vitamin D deficiency Update vitamin D and provide recommendations   Encounter for screening for other viral diseases Update hepatitis C  Positive ANA She would like to recheck her ANA -- will reorder today Consider referral to rheum if persists or changes  Smoker Rare cigarette use -- encouraged reduction/cessation  I, Isabelle Course, acting as a Neurosurgeon for Jarold Motto, [place]., have documented all relevant documentation on the behalf of Jarold Motto, [place], as directed by  Jarold Motto, PA while in the presence of Jarold Motto, [place].  I, Jarold Motto, [place], have reviewed all documentation for this visit. The documentation on 01/12/24 for the exam, diagnosis, procedures, and orders are all accurate and complete.  Jarold Motto, PA-C [facility]

## 2024-01-14 LAB — ANTI-NUCLEAR AB-TITER (ANA TITER)
ANA TITER: 1:80 {titer} — ABNORMAL HIGH
ANA Titer 1: 1:40 {titer} — ABNORMAL HIGH

## 2024-01-14 LAB — HEPATITIS C ANTIBODY: Hepatitis C Ab: NONREACTIVE

## 2024-01-14 LAB — ANA: Anti Nuclear Antibody (ANA): POSITIVE — AB

## 2024-01-15 ENCOUNTER — Other Ambulatory Visit: Payer: Self-pay | Admitting: Physician Assistant

## 2024-01-15 ENCOUNTER — Other Ambulatory Visit (HOSPITAL_COMMUNITY): Payer: Self-pay

## 2024-01-15 ENCOUNTER — Encounter: Payer: Self-pay | Admitting: Physician Assistant

## 2024-01-15 ENCOUNTER — Telehealth: Payer: Self-pay

## 2024-01-15 DIAGNOSIS — R748 Abnormal levels of other serum enzymes: Secondary | ICD-10-CM

## 2024-01-15 MED ORDER — VITAMIN D (ERGOCALCIFEROL) 1.25 MG (50000 UNIT) PO CAPS: 50000.0000 [IU] | ORAL_CAPSULE | ORAL | 0 refills | Status: DC

## 2024-01-15 NOTE — Telephone Encounter (Signed)
 Pharmacy Patient Advocate Encounter   Received notification from Onbase that prior authorization for Zepbound 2.5MG /0.5ML is required/requested.   Insurance verification completed.   The patient is insured through [facility] ADVANTAGE/RX ADVANCE .   Per test claim: Product not covered, Plan/benefit exclusion

## 2024-01-20 ENCOUNTER — Encounter: Payer: Self-pay | Admitting: Dermatology

## 2024-01-20 ENCOUNTER — Ambulatory Visit: Payer: Self-pay | Admitting: Dermatology

## 2024-01-20 VITALS — BP 102/71

## 2024-01-20 DIAGNOSIS — L409 Psoriasis, unspecified: Secondary | ICD-10-CM | POA: Diagnosis not present

## 2024-01-20 MED ORDER — BETAMETHASONE DIPROPIONATE 0.05 % EX CREA: TOPICAL_CREAM | Freq: Two times a day (BID) | CUTANEOUS | 3 refills | Status: AC | PRN

## 2024-01-20 MED ORDER — TACROLIMUS 0.1 % EX OINT: TOPICAL_OINTMENT | Freq: Every day | CUTANEOUS | 0 refills | Status: AC

## 2024-01-20 MED ORDER — CALCIPOTRIENE-BETAMETH DIPROP 0.005-0.064 % EX OINT: TOPICAL_OINTMENT | Freq: Every day | CUTANEOUS | 0 refills | Status: DC

## 2024-01-20 NOTE — Progress Notes (Unsigned)
 New Patient Visit   Subjective  Rachel Paul is a 33 y.o. female who presents for the following: Psoriasis  Patient states she has psoriasis located at the scalp, stomach , ears and left armpit that she would like to have examined. Patient reports the areas have been there for 2 years. She reports the areas are bothersome.Patient rates irritation 7 out of 10. She states that the areas have not spread. Patient reports she has previously been treated for these areas. Patient has used a steroid shampoo on scalp that was previously given by former dermatologist.     The following portions of the chart were reviewed this encounter and updated as appropriate: medications, allergies, medical history  Review of Systems:  No other skin or systemic complaints except as noted in HPI or Assessment and Plan.  Objective  Well appearing patient in no apparent distress; mood and affect are within normal limits.    A focused examination was performed of the following areas:   Relevant exam findings are noted in the Assessment and Plan.    Assessment & Plan   PSORIASIS Exam: Well-demarcated erythematous papules/plaques with silvery scale, guttate pink scaly papules. 10% BSA.    patient denies joint pain  Pt education discussed during visit: Psoriasis is a chronic non-curable, but treatable genetic/hereditary disease that may have other systemic features affecting other organ systems such as joints (Psoriatic Arthritis). It is associated with an increased risk of inflammatory bowel disease, heart disease, non-alcoholic fatty liver disease, and depression.  Treatments include light and laser treatments; topical medications; and systemic medications including oral and injectables.  - Assessment: Psoriasis affecting scalp, abdomen, and armpits. Pink atrophic plaque, scaly erythematous plaque with mild scale on abdomen, and thick erythematous plaque with thick mycaceous silvery scale on scalp.  Previous treatments with steroid shampoo and creams were ineffective. Scalp is most symptomatic, causing pain and interfering with deodorant use. Potential joint involvement. Positive ANA test from 2 years ago may indicate associated autoimmune conditions.  - Plan:    Prescribe Taclonex liquid suspension for scalp application    Recommend zinc-based shampoo for scalp    Prescribe clobetasol for body application    Alternate between clobetasol and betamethasone for 2 weeks each on body, then switch to tacrolimus    Use tacrolimus only for armpits due to thin skin    For abdomen, apply betamethasone and tacrolimus twice daily    Initiate biologic therapy with Cristy Folks (preferred due to quarterly dosing)    Perform TB screening using quantiferon gold test    Conduct hepatitis screening (recently completed)    Schedule injection training once Cristy Folks is approved    Follow up in four months to monitor progress    Notify if planning to conceive within the next year  Reviewed risks of biologics including immunosuppression, infections, injection site reaction, and failure to improve condition. Goal is control of skin condition, not cure.  Some older biologics such as Humira and Enbrel may slightly increase risk of malignancy and may worsen congestive heart failure.  Taltz and Cosentyx may cause inflammatory bowel disease to flare. The use of biologics requires long term medication management, including periodic office visits and monitoring of blood work.    Pt is not a candidate for methotrexate or cyclosporine due to inability for follow up labs and contraindications with other medications. Pt has no access to a light box for phototherapy.  Due to the progressive and chronic nature of her psoriasis, patient has tried  and failed numerous topicals creams as noted above, the next best therapeutic option is a systemic therapy. It is medically necessary to help improve her quality of life.      Return  in about 4 months (around 05/21/2024) for Psoriasis.  Samara Deist, CMA, am acting as scribe for [employer], DO.   Documentation: I have reviewed the above documentation for accuracy and completeness, and I agree with the above.  Langston Reusing, DO

## 2024-01-20 NOTE — Patient Instructions (Signed)
 Dear Ms. Rachel Paul,  Thank you for visiting today. Here is a summary of the key instructions:  Diagnosis: Psoriasis  - Medications for Scalp:   - Use Taclonex liquid suspension   - Use CeraVe zinc-based shampoo  - Body Treatment:   - Alternate between clobetasol and betamethasone for 2 weeks each   - Then switch to tacrolimus  - Armpit Treatment:   - Use only tacrolimus due to thin skin   - Prescriptions:   - Three prescriptions will be sent to [facility]   - [facility] will check your insurance and tell you about the copay   - Call us if there are any issues with your prescriptions  - Required Testing:   - You need a TB screening test called quantiferon gold  - Treatment Plan:   - We plan to start you on Skyrizi, an injectable medication for psoriasis   - Morrie Sheldon will help with paperwork for [employer]   - Approval for Cristy Folks takes about three weeks   - Once approved, you'll get injection training   - You'll give yourself injections every three months  - Follow-up:   - Come back in four months to check your progress   - We'll schedule injection training once Cristy Folks is approved  - Important Notes:   - Tell us if you plan to get pregnant in the next year   - Call our office if you have any questions or concerns  We look forward to seeing you at your next visit. If you have any questions or concerns before then, please do not hesitate to contact our office.  Warm regards,  Dr. [facility]   Important Information  Due to recent changes in healthcare laws, you may see results of your pathology and/or laboratory studies on MyChart before the doctors have had a chance to review them. We understand that in some cases there may be results that are confusing or concerning to you. Please understand that not all results are received at the same time and often the doctors may need to interpret multiple results in order to provide you with the best plan of  care or course of treatment. Therefore, we ask that you please give [place] 2 business days to thoroughly review all your results before contacting the office for clarification. Should we see a critical lab result, you will be contacted sooner.   If You Need Anything After Your Visit  If you have any questions or concerns for your doctor, please call our main line at [phone] If no one answers, please leave a voicemail as directed and we will return your call as soon as possible. Messages left after 4 pm will be answered the following business day.   You may also send [place] a message via MyChart. We typically respond to MyChart messages within 1-2 business days.  For prescription refills, please ask your pharmacy to contact our office. Our fax number is [phone].  If you have an urgent issue when the clinic is closed that cannot wait until the next business day, you can page your doctor at the number below.    Please note that while we do our best to be available for urgent issues outside of office hours, we are not available 24/7.   If you have an urgent issue and are unable to reach [place], you may choose to seek medical care at your doctor's office, retail clinic, urgent care center, or emergency room.  If you have  a medical emergency, please immediately call 911 or go to the emergency department. In the event of inclement weather, please call our main line at [phone] for an update on the status of any delays or closures.  Dermatology Medication Tips: Please keep the boxes that topical medications come in in order to help keep track of the instructions about where and how to use these. Pharmacies typically print the medication instructions only on the boxes and not directly on the medication tubes.   If your medication is too expensive, please contact our office at [phone] or send [place] a message through MyChart.   We are unable to tell what your co-pay for medications will be in  advance as this is different depending on your insurance coverage. However, we may be able to find a substitute medication at lower cost or fill out paperwork to get insurance to cover a needed medication.   If a prior authorization is required to get your medication covered by your insurance company, please allow [place] 1-2 business days to complete this process.  Drug prices often vary depending on where the prescription is filled and some pharmacies may offer cheaper prices.  The website www.goodrx.com contains coupons for medications through different pharmacies. The prices here do not account for what the cost may be with help from insurance (it may be cheaper with your insurance), but the website can give you the price if you did not use any insurance.  - You can print the associated coupon and take it with your prescription to the pharmacy.  - You may also stop by our office during regular business hours and pick up a GoodRx coupon card.  - If you need your prescription sent electronically to a different pharmacy, notify our office through [facility] or by phone at [phone]

## 2024-01-29 LAB — QUANTIFERON-TB GOLD PLUS
QuantiFERON Mitogen Value: >10 [IU]/mL
QuantiFERON Nil Value: 0.05 [IU]/mL
QuantiFERON TB1 Ag Value: 0.04 [IU]/mL
QuantiFERON TB2 Ag Value: 0.05 [IU]/mL
QuantiFERON-TB Gold Plus: NEGATIVE

## 2024-02-02 ENCOUNTER — Other Ambulatory Visit: Payer: Self-pay

## 2024-02-02 DIAGNOSIS — L409 Psoriasis, unspecified: Secondary | ICD-10-CM

## 2024-02-02 LAB — HM PAP SMEAR

## 2024-02-02 MED ORDER — SKYRIZI PEN 150 MG/ML ~~LOC~~ SOAJ
150.0000 mg | SUBCUTANEOUS | 6 refills | Status: DC
Start: 2024-02-02 — End: 2024-02-18

## 2024-02-02 MED ORDER — SKYRIZI PEN 150 MG/ML ~~LOC~~ SOAJ: 150.0000 mg | SUBCUTANEOUS | 1 refills | Status: DC

## 2024-02-18 ENCOUNTER — Other Ambulatory Visit (HOSPITAL_COMMUNITY): Payer: Self-pay

## 2024-02-18 ENCOUNTER — Other Ambulatory Visit: Payer: Self-pay | Admitting: Dermatology

## 2024-02-18 DIAGNOSIS — L409 Psoriasis, unspecified: Secondary | ICD-10-CM

## 2024-02-18 MED ORDER — SKYRIZI PEN 150 MG/ML ~~LOC~~ SOAJ: 150.0000 mg | SUBCUTANEOUS | 1 refills | Status: DC

## 2024-02-18 MED ORDER — SKYRIZI PEN 150 MG/ML ~~LOC~~ SOAJ: 150.0000 mg | SUBCUTANEOUS | 11 refills | Status: DC

## 2024-02-18 NOTE — Progress Notes (Signed)
 Insurance required a different pharmacy

## 2024-02-22 ENCOUNTER — Other Ambulatory Visit: Payer: Self-pay

## 2024-02-22 DIAGNOSIS — L409 Psoriasis, unspecified: Secondary | ICD-10-CM

## 2024-02-22 MED ORDER — SKYRIZI PEN 150 MG/ML ~~LOC~~ SOAJ: 150.0000 mg | SUBCUTANEOUS | 11 refills | Status: DC

## 2024-02-22 MED ORDER — SKYRIZI PEN 150 MG/ML ~~LOC~~ SOAJ: 150.0000 mg | SUBCUTANEOUS | 1 refills | Status: AC

## 2024-04-19 ENCOUNTER — Ambulatory Visit (INDEPENDENT_AMBULATORY_CARE_PROVIDER_SITE_OTHER): Admitting: Physician Assistant

## 2024-04-19 ENCOUNTER — Encounter: Payer: Self-pay | Admitting: Physician Assistant

## 2024-04-19 ENCOUNTER — Ambulatory Visit: Payer: Self-pay | Admitting: Physician Assistant

## 2024-04-19 VITALS — BP 110/70 | HR 79 | Temp 97.9°F | Ht 63.0 in | Wt 208.2 lb

## 2024-04-19 DIAGNOSIS — E559 Vitamin D deficiency, unspecified: Secondary | ICD-10-CM

## 2024-04-19 DIAGNOSIS — F418 Other specified anxiety disorders: Secondary | ICD-10-CM | POA: Diagnosis not present

## 2024-04-19 DIAGNOSIS — F909 Attention-deficit hyperactivity disorder, unspecified type: Secondary | ICD-10-CM

## 2024-04-19 DIAGNOSIS — R748 Abnormal levels of other serum enzymes: Secondary | ICD-10-CM

## 2024-04-19 DIAGNOSIS — E669 Obesity, unspecified: Secondary | ICD-10-CM | POA: Insufficient documentation

## 2024-04-19 DIAGNOSIS — R768 Other specified abnormal immunological findings in serum: Secondary | ICD-10-CM

## 2024-04-19 LAB — COMPREHENSIVE METABOLIC PANEL WITH GFR
ALT: 20 U/L (ref 0–35)
AST: 35 U/L (ref 0–37)
Albumin: 4.6 g/dL (ref 3.5–5.2)
Alkaline Phosphatase: 55 U/L (ref 39–117)
BUN: 11 mg/dL (ref 6–23)
CO2: 32 meq/L (ref 19–32)
Calcium: 9.9 mg/dL (ref 8.4–10.5)
Chloride: 102 meq/L (ref 96–112)
Creatinine, Ser: 0.68 mg/dL (ref 0.40–1.20)
GFR: 113.54 mL/min (ref >60.00)
Glucose, Bld: 93 mg/dL (ref 70–99)
Potassium: 4.2 meq/L (ref 3.5–5.1)
Sodium: 139 meq/L (ref 135–145)
Total Bilirubin: 0.4 mg/dL (ref 0.2–1.2)
Total Protein: 7.6 g/dL (ref 6.0–8.3)

## 2024-04-19 LAB — VITAMIN D 25 HYDROXY (VIT D DEFICIENCY, FRACTURES): VITD: 30.06 ng/mL (ref 30.00–100.00)

## 2024-04-19 MED ORDER — LORAZEPAM 0.5 MG PO TABS: 0.5000 mg | ORAL_TABLET | Freq: Three times a day (TID) | ORAL | 0 refills | Status: AC | PRN

## 2024-04-19 MED ORDER — AMPHETAMINE-DEXTROAMPHET ER 10 MG PO CP24
10.0000 mg | ORAL_CAPSULE | ORAL | 0 refills | Status: DC
Start: 2024-04-19 — End: 2024-06-21

## 2024-04-19 MED ORDER — AMPHETAMINE-DEXTROAMPHET ER 10 MG PO CP24: 10.0000 mg | ORAL_CAPSULE | ORAL | 0 refills | Status: DC

## 2024-04-19 MED ORDER — VALACYCLOVIR HCL 500 MG PO TABS: 500.0000 mg | ORAL_TABLET | Freq: Every day | ORAL | 1 refills | Status: AC | PRN

## 2024-04-19 MED ORDER — AMPHETAMINE-DEXTROAMPHETAMINE 5 MG PO TABS: 5.0000 mg | ORAL_TABLET | Freq: Every day | ORAL | 0 refills | Status: DC

## 2024-04-19 MED ORDER — AMPHETAMINE-DEXTROAMPHETAMINE 5 MG PO TABS: 5.0000 mg | ORAL_TABLET | Freq: Every day | ORAL | 0 refills | Status: AC

## 2024-04-19 NOTE — Progress Notes (Signed)
 Rachel Paul is a 33 y.o. female here for a follow up of a pre-existing problem.  History of Present Illness:   Chief Complaint  Patient presents with   ADHD    Pt here for f/u and refills, currently taking Adderall XR 10 mg and Adderall 5 mg, tolerating well, denies insomnia, decrease appetite.   Situational anxiety Moods are stable today.  Taking Ativan  0.5 mg rarely. Denies any SI/HI.   ADHD: Currently on Adderall XR 10 mg and Adderall 5 mg Good compliance and tolerance reported today  She does not take this medication often but when she does it is very effective for her She takes about 2-3 times per week  Elevated liver labs She had some elevated liver labs last time it was checked about 3 months ago She reports at that time she was drinking excessive alcohol She has since reduced her alcohol to monthly She is agreeable to rechecking this today  Positive ANA She had evaluation with Dr. Lonni Ester in 2021 for this and had additional testing that was overall normal and did not indicate any specific autoimmune disorder She is now on Skyrizi  for her plaque psoriasis through her dermatologist ANA recheck shows slightly increased ANA and she is wondering if she needs repeat testing or evaluation through rheumatology  Past Medical History:  Diagnosis Date   Allergy    Anxiety    Asthma    Depression    Dysmenorrhea    Pyelonephritis      Social History   Tobacco Use   Smoking status: Some Days    Current packs/day: 0.00    Types: Cigarettes   Smokeless tobacco: Never   Tobacco comments:    Very irregular smoking cigarettes  Vaping Use   Vaping status: Never Used  Substance Use Topics   Alcohol use: Yes    Alcohol/week: 1.0 standard drink of alcohol    Types: 1 Cans of beer per week    Comment: Rarely drink, once per month   Drug use: Not Currently    Types: Marijuana    Comment: 03/15/22    Past Surgical History:  Procedure Laterality Date   WISDOM  TOOTH EXTRACTION      Family History  Problem Relation Age of Onset   Thyroid disease Mother    Asthma Mother    Sleep apnea Mother    ADD / ADHD Mother    Miscarriages / Stillbirths Mother    Obesity Mother    Skin cancer Father    Hearing loss Father    Asthma Brother    ADD / ADHD Brother    Sleep apnea Brother    ADD / ADHD Brother    Depression Brother    Obesity Brother    Ovarian cancer Maternal Grandmother    Diabetes Maternal Grandmother    Breast cancer Maternal Grandmother        ?   Obesity Maternal Grandmother    Stroke Maternal Grandmother    Diabetes Maternal Grandfather    Alcohol abuse Maternal Grandfather    Early death Maternal Grandfather    Obesity Maternal Grandfather    Anxiety disorder Paternal Grandmother    Diabetes Paternal Grandfather    Prostate cancer Paternal Grandfather    Hearing loss Paternal Grandfather    Colon cancer Neg Hx     Allergies  Allergen Reactions   Amoxicillin Hives    Other reaction(s): Unknown   Azithromycin Itching and Swelling    Other reaction(s): Unknown  Levaquin [Levofloxacin In D5w] Hives   Levofloxacin     Other reaction(s): Unknown    Current Medications:   Current Outpatient Medications:    alclomethasone (ACLOVATE ) 0.05 % cream, Apply to affected area every morning, Disp: 180 g, Rfl: 6   amphetamine -dextroamphetamine  (ADDERALL XR) 10 MG 24 hr capsule, Take 1 capsule (10 mg total) by mouth every morning., Disp: 30 capsule, Rfl: 0   amphetamine -dextroamphetamine  (ADDERALL XR) 10 MG 24 hr capsule, Take 1 capsule (10 mg total) by mouth every morning., Disp: 30 capsule, Rfl: 0   [START ON 05/19/2024] amphetamine -dextroamphetamine  (ADDERALL XR) 10 MG 24 hr capsule, Take 1 capsule (10 mg total) by mouth every morning., Disp: 30 capsule, Rfl: 0   [START ON 06/18/2024] amphetamine -dextroamphetamine  (ADDERALL XR) 10 MG 24 hr capsule, Take 1 capsule (10 mg total) by mouth every morning., Disp: 30 capsule, Rfl: 0    amphetamine -dextroamphetamine  (ADDERALL) 5 MG tablet, Take 1 tablet (5 mg total) by mouth daily before breakfast., Disp: 30 tablet, Rfl: 0   [START ON 05/19/2024] amphetamine -dextroamphetamine  (ADDERALL) 5 MG tablet, Take 1 tablet (5 mg total) by mouth daily before breakfast., Disp: 30 tablet, Rfl: 0   [START ON 06/18/2024] amphetamine -dextroamphetamine  (ADDERALL) 5 MG tablet, Take 1 tablet (5 mg total) by mouth daily before breakfast., Disp: 30 tablet, Rfl: 0   betamethasone  dipropionate 0.05 % cream, Apply topically 2 (two) times daily as needed (Rash)., Disp: 45 g, Rfl: 3   risankizumab -rzaa (SKYRIZI  PEN) 150 MG/ML pen, Inject 1 mL (150 mg total) into the skin as directed. At weeks 0 & 4., Disp: 1 mL, Rfl: 1   tacrolimus  (PROTOPIC ) 0.1 % ointment, Apply topically daily., Disp: 100 g, Rfl: 0   LORazepam  (ATIVAN ) 0.5 MG tablet, Take 1 tablet (0.5 mg total) by mouth every 8 (eight) hours as needed for anxiety., Disp: 20 tablet, Rfl: 0   valACYclovir  (VALTREX ) 500 MG tablet, Take 1 tablet (500 mg total) by mouth daily as needed., Disp: 90 tablet, Rfl: 1   Review of Systems:   Negative unless otherwise specified per HPI.  Vitals:   Vitals:   04/19/24 0907  BP: 110/70  Pulse: 79  Temp: 97.9 F (36.6 C)  TempSrc: Temporal  SpO2: 96%  Weight: 208 lb 4 oz (94.5 kg)  Height: 5' 3 (1.6 m)     Body mass index is 36.89 kg/m.  Physical Exam:   Physical Exam Vitals and nursing note reviewed.  Constitutional:      General: She is not in acute distress.    Appearance: She is well-developed. She is not ill-appearing or toxic-appearing.   Cardiovascular:     Rate and Rhythm: Normal rate and regular rhythm.     Pulses: Normal pulses.     Heart sounds: Normal heart sounds, S1 normal and S2 normal.  Pulmonary:     Effort: Pulmonary effort is normal.     Breath sounds: Normal breath sounds.   Skin:    General: Skin is warm and dry.   Neurological:     Mental Status: She is alert.      GCS: GCS eye subscore is 4. GCS verbal subscore is 5. GCS motor subscore is 6.   Psychiatric:        Speech: Speech normal.        Behavior: Behavior normal. Behavior is cooperative.     Assessment and Plan:   1. Attention deficit hyperactivity disorder (ADHD), unspecified ADHD type (Primary) PDMP reviewed during this encounter. No red flags on  discussion We will continue Adderall XR 10 mg in the a.m. and as needed Adderall 5 mg in afternoon Follow-up in 3 months, sooner if concerns  2. Elevated liver enzymes - Comp Met (CMET)  Recheck elevated liver labs and make appropriate recommendations Continue alcohol reduction  3. Vitamin D  deficiency - VITAMIN D  25 Hydroxy (Vit-D Deficiency, Fractures)  Update vitamin D  and provide recommendations  4. Situational anxiety  Overall well-controlled but does need refill on as needed Ativan  Follow-up in 3 months, sooner if concerns Denies suicidal or homicidal ideation  5. Positive ANA (antinuclear antibody)   We reviewed her recent ANA result/changes I reached out to rheumatology to see if another appointment is recommended or if we should continue to monitor versus obtain additional blood work I will reach out to patient with the verdict of this conversation  Symone Cornman, PA-C

## 2024-04-20 ENCOUNTER — Other Ambulatory Visit: Payer: Self-pay | Admitting: Physician Assistant

## 2024-04-25 ENCOUNTER — Encounter: Payer: Self-pay | Admitting: Physician Assistant

## 2024-04-25 ENCOUNTER — Other Ambulatory Visit: Payer: Self-pay | Admitting: Physician Assistant

## 2024-04-25 DIAGNOSIS — R768 Other specified abnormal immunological findings in serum: Secondary | ICD-10-CM

## 2024-06-15 ENCOUNTER — Encounter: Payer: Self-pay | Admitting: Physician Assistant

## 2024-06-17 ENCOUNTER — Other Ambulatory Visit (INDEPENDENT_AMBULATORY_CARE_PROVIDER_SITE_OTHER)

## 2024-06-17 DIAGNOSIS — R768 Other specified abnormal immunological findings in serum: Secondary | ICD-10-CM | POA: Diagnosis not present

## 2024-06-20 LAB — ANA+ENA+DNA/DS+SCL 70+SJOSSA/B
ANA Titer 1: POSITIVE — AB
ENA RNP Ab: 0.2 AI (ref 0.0–0.9)
ENA SM Ab Ser-aCnc: <0.2 AI (ref 0.0–0.9)
ENA SSA (RO) Ab: <0.2 AI (ref 0.0–0.9)
ENA SSB (LA) Ab: <0.2 AI (ref 0.0–0.9)
Scleroderma (Scl-70) (ENA) Antibody, IgG: <0.2 AI (ref 0.0–0.9)
dsDNA Ab: 1 [IU]/mL (ref 0–9)

## 2024-06-20 LAB — FANA STAINING PATTERNS: Homogeneous Pattern: 1:160 {titer} — ABNORMAL HIGH

## 2024-06-21 ENCOUNTER — Encounter: Payer: Self-pay | Admitting: Physician Assistant

## 2024-06-21 ENCOUNTER — Ambulatory Visit: Admitting: Physician Assistant

## 2024-06-21 VITALS — BP 110/74 | HR 88 | Temp 97.3°F | Ht 63.0 in | Wt 206.2 lb

## 2024-06-21 DIAGNOSIS — R21 Rash and other nonspecific skin eruption: Secondary | ICD-10-CM | POA: Diagnosis not present

## 2024-06-21 DIAGNOSIS — F909 Attention-deficit hyperactivity disorder, unspecified type: Secondary | ICD-10-CM | POA: Diagnosis not present

## 2024-06-21 DIAGNOSIS — E669 Obesity, unspecified: Secondary | ICD-10-CM | POA: Diagnosis not present

## 2024-06-21 DIAGNOSIS — R768 Other specified abnormal immunological findings in serum: Secondary | ICD-10-CM

## 2024-06-21 MED ORDER — AMPHETAMINE-DEXTROAMPHET ER 10 MG PO CP24: 10.0000 mg | ORAL_CAPSULE | ORAL | 0 refills | Status: DC

## 2024-06-21 NOTE — Progress Notes (Signed)
 Rachel Paul is a 33 y.o. female here for a follow up of a pre-existing problem.  History of Present Illness:   Chief Complaint  Patient presents with   ADHD    Pt here for 3 month f/u and refills. Pt is currently taking Adderall 5 mg and Adderall XR 10 mg.   Weight Management Screening    Pt would like to discuss weight loss medications.    Discussed the use of AI scribe software for clinical note transcription with the patient, who gave verbal consent to proceed.  History of Present Illness Rachel Paul is a 33 year old female who presents for medication management and follow-up on lab results.  She is currently taking Adderall XR 10 mg and immediate-release 5 mg, which she finds effective. She requests a refill for the 10 mg xr dose and has enough of the 5 mg dose remaining.  She is exploring self-pay options for GLP-1 medications like Wegovy or Zepbound  for weight management, as her insurance does not cover them. 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 She exercises twice a week, focusing on cardio and weightlifting, and is cautious about weight loss methods due to a history of anorexia.  Her ANA levels have increased from 1:140 to 1:160 over the past five months. She experiences worsening scaliness on her skin, previously managed with Skyrizi , which she has not taken for a month due to difficulty obtaining it. She is concerned that her skin symptoms may be related to her ANA levels.    Past Medical History:  Diagnosis Date   Allergy    Anxiety    Asthma    Depression    Dysmenorrhea    Pyelonephritis      Social History   Tobacco Use   Smoking status: Some Days    Current packs/day: 0.00    Types: Cigarettes   Smokeless tobacco: Never   Tobacco comments:    Very irregular smoking cigarettes  Vaping Use   Vaping status: Never Used  Substance Use Topics   Alcohol use: Yes     Alcohol/week: 1.0 standard drink of alcohol    Types: 1 Cans of beer per week    Comment: Rarely drink, once per month   Drug use: Not Currently    Types: Marijuana    Comment: 03/15/22    Past Surgical History:  Procedure Laterality Date   WISDOM TOOTH EXTRACTION      Family History  Problem Relation Age of Onset   Thyroid disease Mother    Asthma Mother    Sleep apnea Mother    ADD / ADHD Mother    Miscarriages / Stillbirths Mother    Obesity Mother    Skin cancer Father    Hearing loss Father    Asthma Brother    ADD / ADHD Brother    Sleep apnea Brother    ADD / ADHD Brother    Depression Brother    Obesity Brother    Ovarian cancer Maternal Grandmother    Diabetes Maternal Grandmother    Breast cancer Maternal Grandmother        ?   Obesity Maternal Grandmother    Stroke Maternal Grandmother    Diabetes Maternal Grandfather    Alcohol abuse Maternal Grandfather    Early death Maternal Grandfather    Obesity Maternal Grandfather    Anxiety disorder Paternal Grandmother  Diabetes Paternal Grandfather    Prostate cancer Paternal Grandfather    Hearing loss Paternal Grandfather    Colon cancer Neg Hx     Allergies  Allergen Reactions   Amoxicillin Hives    Other reaction(s): Unknown   Azithromycin Itching and Swelling    Other reaction(s): Unknown   Levaquin [Levofloxacin In D5w] Hives   Levofloxacin     Other reaction(s): Unknown    Current Medications:   Current Outpatient Medications:    alclomethasone (ACLOVATE ) 0.05 % cream, Apply to affected area every morning, Disp: 180 g, Rfl: 6   amphetamine -dextroamphetamine  (ADDERALL XR) 10 MG 24 hr capsule, Take 1 capsule (10 mg total) by mouth every morning., Disp: 30 capsule, Rfl: 0   amphetamine -dextroamphetamine  (ADDERALL XR) 10 MG 24 hr capsule, Take 1 capsule (10 mg total) by mouth every morning., Disp: 30 capsule, Rfl: 0   amphetamine -dextroamphetamine  (ADDERALL XR) 10 MG 24 hr capsule, Take 1  capsule (10 mg total) by mouth every morning., Disp: 30 capsule, Rfl: 0   amphetamine -dextroamphetamine  (ADDERALL XR) 10 MG 24 hr capsule, Take 1 capsule (10 mg total) by mouth every morning., Disp: 30 capsule, Rfl: 0   amphetamine -dextroamphetamine  (ADDERALL) 5 MG tablet, Take 1 tablet (5 mg total) by mouth daily before breakfast., Disp: 30 tablet, Rfl: 0   amphetamine -dextroamphetamine  (ADDERALL) 5 MG tablet, Take 1 tablet (5 mg total) by mouth daily before breakfast., Disp: 30 tablet, Rfl: 0   amphetamine -dextroamphetamine  (ADDERALL) 5 MG tablet, Take 1 tablet (5 mg total) by mouth daily before breakfast., Disp: 30 tablet, Rfl: 0   betamethasone  dipropionate 0.05 % cream, Apply topically 2 (two) times daily as needed (Rash)., Disp: 45 g, Rfl: 3   LORazepam  (ATIVAN ) 0.5 MG tablet, Take 1 tablet (0.5 mg total) by mouth every 8 (eight) hours as needed for anxiety., Disp: 20 tablet, Rfl: 0   risankizumab -rzaa (SKYRIZI  PEN) 150 MG/ML pen, Inject 1 mL (150 mg total) into the skin as directed. At weeks 0 & 4., Disp: 1 mL, Rfl: 1   tacrolimus  (PROTOPIC ) 0.1 % ointment, Apply topically daily., Disp: 100 g, Rfl: 0   valACYclovir  (VALTREX ) 500 MG tablet, Take 1 tablet (500 mg total) by mouth daily as needed., Disp: 90 tablet, Rfl: 1   Review of Systems:   Negative unless otherwise specified per HPI.  Vitals:   Vitals:   06/21/24 0939  BP: 110/74  Pulse: 88  Temp: (!) 97.3 F (36.3 C)  TempSrc: Temporal  SpO2: 99%  Weight: 206 lb 4 oz (93.6 kg)  Height: 5' 3 (1.6 m)     Body mass index is 36.54 kg/m.  Physical Exam:   Physical Exam Constitutional:      Appearance: Normal appearance. She is well-developed.  HENT:     Head: Normocephalic and atraumatic.  Eyes:     General: Lids are normal.     Extraocular Movements: Extraocular movements intact.     Conjunctiva/sclera: Conjunctivae normal.  Pulmonary:     Effort: Pulmonary effort is normal.  Musculoskeletal:        General: Normal  range of motion.     Cervical back: Normal range of motion and neck supple.  Skin:    General: Skin is warm and dry.  Neurological:     Mental Status: She is alert and oriented to person, place, and time.  Psychiatric:        Attention and Perception: Attention and perception normal.        Mood and Affect:  Mood normal.        Behavior: Behavior normal.        Thought Content: Thought content normal.        Judgment: Judgment normal.     Assessment and Plan:   Assessment and Plan Assessment & Plan Obesity Experiencing weight gain despite healthy lifestyle. Prefers pen administration due to needle phobia. Zepbound  may be more effective than Wegovy. - Provide sample for trial. - Start with 0.25 mg every other week. - Increase to weekly dosing if tolerated. - Encourage exercise 3-4 days a week. - Discuss side effects and dietary adjustments. - Message provider after sample trial.  Attention-deficit hyperactivity disorder (ADHD) Stable on current Adderall regimen. - Refill Adderall XR 10 mg prescription. - Continue Adderall 5 mg daily as needed - declines refill today  Elevated antinuclear antibody (ANA) titer with scaly skin rash ANA titer increased. Worsening rash, previously managed with Skyrizi . Prefers new rheumatologist due to past negative experience. - Refer to rheumatology for ANA titer and rash evaluation. - Attempt referral to new rheumatologist or PA Waddell Craze.     Lucie Buttner, PA-C

## 2024-09-27 ENCOUNTER — Encounter: Payer: Self-pay | Admitting: Physician Assistant

## 2024-09-27 ENCOUNTER — Ambulatory Visit: Admitting: Physician Assistant

## 2024-09-27 VITALS — BP 110/78 | HR 88 | Temp 97.7°F | Ht 63.0 in | Wt 210.5 lb

## 2024-09-27 DIAGNOSIS — F418 Other specified anxiety disorders: Secondary | ICD-10-CM

## 2024-09-27 DIAGNOSIS — F909 Attention-deficit hyperactivity disorder, unspecified type: Secondary | ICD-10-CM

## 2024-09-27 DIAGNOSIS — N912 Amenorrhea, unspecified: Secondary | ICD-10-CM | POA: Diagnosis not present

## 2024-09-27 DIAGNOSIS — E669 Obesity, unspecified: Secondary | ICD-10-CM

## 2024-09-27 LAB — COMPREHENSIVE METABOLIC PANEL WITH GFR
ALT: 35 U/L (ref 0–35)
AST: 43 U/L — ABNORMAL HIGH (ref 0–37)
Albumin: 4.7 g/dL (ref 3.5–5.2)
Alkaline Phosphatase: 58 U/L (ref 39–117)
BUN: 17 mg/dL (ref 6–23)
CO2: 28 meq/L (ref 19–32)
Calcium: 9.4 mg/dL (ref 8.4–10.5)
Chloride: 100 meq/L (ref 96–112)
Creatinine, Ser: 0.66 mg/dL (ref 0.40–1.20)
GFR: 114.01 mL/min (ref >60.00)
Glucose, Bld: 96 mg/dL (ref 70–99)
Potassium: 3.7 meq/L (ref 3.5–5.1)
Sodium: 137 meq/L (ref 135–145)
Total Bilirubin: 0.4 mg/dL (ref 0.2–1.2)
Total Protein: 7.3 g/dL (ref 6.0–8.3)

## 2024-09-27 LAB — CBC WITH DIFFERENTIAL/PLATELET
Basophils Absolute: 0 K/uL (ref 0.0–0.1)
Basophils Relative: 0.6 % (ref 0.0–3.0)
Eosinophils Absolute: 0.1 K/uL (ref 0.0–0.7)
Eosinophils Relative: 1.8 % (ref 0.0–5.0)
HCT: 38.6 % (ref 36.0–46.0)
Hemoglobin: 13.2 g/dL (ref 12.0–15.0)
Lymphocytes Relative: 35.7 % (ref 12.0–46.0)
Lymphs Abs: 2.3 K/uL (ref 0.7–4.0)
MCHC: 34.3 g/dL (ref 30.0–36.0)
MCV: 92.2 fl (ref 78.0–100.0)
Monocytes Absolute: 0.3 K/uL (ref 0.1–1.0)
Monocytes Relative: 5.2 % (ref 3.0–12.0)
Neutro Abs: 3.7 K/uL (ref 1.4–7.7)
Neutrophils Relative %: 56.7 % (ref 43.0–77.0)
Platelets: 272 K/uL (ref 150.0–400.0)
RBC: 4.18 Mil/uL (ref 3.87–5.11)
RDW: 12.7 % (ref 11.5–15.5)
WBC: 6.5 K/uL (ref 4.0–10.5)

## 2024-09-27 LAB — FOLLICLE STIMULATING HORMONE: FSH: 5.5 m[IU]/mL

## 2024-09-27 LAB — HCG, QUANTITATIVE, PREGNANCY: Quantitative HCG: <0.6 m[IU]/mL

## 2024-09-27 LAB — TSH: TSH: 2.82 u[IU]/mL (ref 0.35–5.50)

## 2024-09-27 MED ORDER — AMPHETAMINE-DEXTROAMPHET ER 10 MG PO CP24: 10.0000 mg | ORAL_CAPSULE | ORAL | 0 refills | Status: AC

## 2024-09-27 MED ORDER — AMPHETAMINE-DEXTROAMPHETAMINE 5 MG PO TABS: 5.0000 mg | ORAL_TABLET | Freq: Every day | ORAL | 0 refills | Status: AC

## 2024-09-27 MED ORDER — PROPRANOLOL HCL 10 MG PO TABS: 10.0000 mg | ORAL_TABLET | Freq: Three times a day (TID) | ORAL | 1 refills | Status: AC

## 2024-09-27 NOTE — Progress Notes (Signed)
 History of Present Illness:   Chief Complaint  Patient presents with   ADHD    Pt here for 3 month f/u, currently taking Adderall XR 10 mg and Adderall 5 mg. Tolerating well.   Menstrual Problem    Pt has not had a period since Sept. Pt is under a lot of stress right now.    Discussed the use of AI scribe software for clinical note transcription with the patient, who gave verbal consent to proceed.  History of Present Illness   Rachel Paul is a 33 year old female who presents with amenorrhea and stress-related symptoms.  She has not had a menstrual period since September and is concerned about possible pregnancy, especially given alcohol use over the holidays. She has taken three home pregnancy tests, most recently two days ago, all negative.  She started Skyrizi  on November 4 after she had already missed her period and continues it. She was told Skyrizi  might increase susceptibility to pregnancy but has not had frequent or unprotected sex.  She feels in a constant state of tension, with body and muscle tightness that improves when she stretches nightly. She previously used propranolol  for anxiety attacks with benefit but is not using it now. She dislikes the foggy feeling from Lexapro and currently uses lorazepam  as needed for acute stress.  Her husband recently had a mental health crisis and is now back on OCD medication, which has helped, but the event and family pressure about her marriage have significantly increased her stress.  She is worried about stress-related weight gain despite a healthy vegetarian diet. She used a prior medication sample that helped her weight and stress, but it is not covered by her current insurance.  She denies feeling anxious and describes her symptoms as persistent tension rather than episodic anxiety.      Reports there is no suicidal ideation/hi   Past Medical History:  Diagnosis Date   Allergy    Anxiety    Asthma    Depression     Dysmenorrhea    Pyelonephritis      Social History   Tobacco Use   Smoking status: Some Days    Current packs/day: 0.00    Types: Cigarettes   Smokeless tobacco: Never   Tobacco comments:    Very irregular smoking cigarettes  Vaping Use   Vaping status: Never Used  Substance Use Topics   Alcohol use: Yes    Alcohol/week: 1.0 standard drink of alcohol    Types: 1 Cans of beer per week    Comment: Rarely drink, once per month   Drug use: Not Currently    Types: Marijuana    Comment: 03/15/22    Past Surgical History:  Procedure Laterality Date   WISDOM TOOTH EXTRACTION      Family History  Problem Relation Age of Onset   Thyroid disease Mother    Asthma Mother    Sleep apnea Mother    ADD / ADHD Mother    Miscarriages / Stillbirths Mother    Obesity Mother    Skin cancer Father    Hearing loss Father    Asthma Brother    ADD / ADHD Brother    Sleep apnea Brother    ADD / ADHD Brother    Depression Brother    Obesity Brother    Ovarian cancer Maternal Grandmother    Diabetes Maternal Grandmother    Breast cancer Maternal Grandmother        ?  Obesity Maternal Grandmother    Stroke Maternal Grandmother    Diabetes Maternal Grandfather    Alcohol abuse Maternal Grandfather    Early death Maternal Grandfather    Obesity Maternal Grandfather    Anxiety disorder Paternal Grandmother    Diabetes Paternal Grandfather    Prostate cancer Paternal Grandfather    Hearing loss Paternal Grandfather    Colon cancer Neg Hx     Allergies  Allergen Reactions   Amoxicillin Hives    Other reaction(s): Unknown   Azithromycin Itching and Swelling    Other reaction(s): Unknown   Levaquin [Levofloxacin In D5w] Hives   Levofloxacin     Other reaction(s): Unknown    Current Medications:   Current Outpatient Medications:    alclomethasone (ACLOVATE ) 0.05 % cream, Apply to affected area every morning, Disp: 180 g, Rfl: 6   amphetamine -dextroamphetamine  (ADDERALL XR) 10  MG 24 hr capsule, Take 1 capsule (10 mg total) by mouth every morning., Disp: 30 capsule, Rfl: 0   [START ON 10/27/2024] amphetamine -dextroamphetamine  (ADDERALL XR) 10 MG 24 hr capsule, Take 1 capsule (10 mg total) by mouth every morning., Disp: 30 capsule, Rfl: 0   [START ON 11/26/2024] amphetamine -dextroamphetamine  (ADDERALL XR) 10 MG 24 hr capsule, Take 1 capsule (10 mg total) by mouth every morning., Disp: 30 capsule, Rfl: 0   amphetamine -dextroamphetamine  (ADDERALL) 5 MG tablet, Take 1 tablet (5 mg total) by mouth daily before breakfast., Disp: 30 tablet, Rfl: 0   amphetamine -dextroamphetamine  (ADDERALL) 5 MG tablet, Take 1 tablet (5 mg total) by mouth daily with lunch., Disp: 30 tablet, Rfl: 0   [START ON 10/27/2024] amphetamine -dextroamphetamine  (ADDERALL) 5 MG tablet, Take 1 tablet (5 mg total) by mouth daily with lunch., Disp: 30 tablet, Rfl: 0   [START ON 11/26/2024] amphetamine -dextroamphetamine  (ADDERALL) 5 MG tablet, Take 1 tablet (5 mg total) by mouth daily with lunch., Disp: 30 tablet, Rfl: 0   betamethasone  dipropionate 0.05 % cream, Apply topically 2 (two) times daily as needed (Rash)., Disp: 45 g, Rfl: 3   LORazepam  (ATIVAN ) 0.5 MG tablet, Take 1 tablet (0.5 mg total) by mouth every 8 (eight) hours as needed for anxiety., Disp: 20 tablet, Rfl: 0   propranolol  (INDERAL ) 10 MG tablet, Take 1 tablet (10 mg total) by mouth 3 (three) times daily., Disp: 60 tablet, Rfl: 1   risankizumab -rzaa (SKYRIZI  PEN) 150 MG/ML pen, Inject 1 mL (150 mg total) into the skin as directed. At weeks 0 & 4., Disp: 1 mL, Rfl: 1   tacrolimus  (PROTOPIC ) 0.1 % ointment, Apply topically daily., Disp: 100 g, Rfl: 0   valACYclovir  (VALTREX ) 500 MG tablet, Take 1 tablet (500 mg total) by mouth daily as needed., Disp: 90 tablet, Rfl: 1   Review of Systems:   Negative unless otherwise specified per HPI.  Vitals:   Vitals:   09/27/24 0801  BP: 110/78  Pulse: 88  Temp: 97.7 F (36.5 C)  TempSrc: Temporal  SpO2:  99%  Weight: 210 lb 8 oz (95.5 kg)  Height: 5' 3 (1.6 m)     Body mass index is 37.29 kg/m.  Physical Exam:   Physical Exam Vitals and nursing note reviewed.  Constitutional:      General: She is not in acute distress.    Appearance: She is well-developed. She is not ill-appearing or toxic-appearing.  Cardiovascular:     Rate and Rhythm: Normal rate and regular rhythm.     Pulses: Normal pulses.     Heart sounds: Normal heart sounds, S1  normal and S2 normal.  Pulmonary:     Effort: Pulmonary effort is normal.     Breath sounds: Normal breath sounds.  Skin:    General: Skin is warm and dry.  Neurological:     Mental Status: She is alert.     GCS: GCS eye subscore is 4. GCS verbal subscore is 5. GCS motor subscore is 6.  Psychiatric:        Speech: Speech normal.        Behavior: Behavior normal. Behavior is cooperative.     Assessment and Plan:   Assessment and Plan    Amenorrhea Amenorrhea since September, likely stress-induced. Negative pregnancy tests. Skyrizi  unlikely cause. Perimenopausal status considered but not pursued. - Ordered blood pregnancy test. - Ordered hormone level blood work. - recommend close follow up with gynecology if persists  Obesity Ozempic trial resulted in some side effects, but most doses were tolerated. Insurance change may affect coverage. Oral options expected in January. [facility] sample provided. - Provided Wegovy sample. - Discussed oral weight management options for January.  Attention-deficit hyperactivity disorder ADHD managed with Adderall. - Refilled Adderall prescription. - prescription drug monitoring program reviewed - no red flags  Situational anxiety  Chronic stress exacerbated by personal circumstances. Lexapro caused cognitive fog. Propranolol  effective for anxiety attacks. Considering Celexa for daily management. - Refilled propranolol  prescription. - Discussed trial of Celexa. Declined. - Refilled lorazepam   prescription.  - reports there is no suicidal ideation/hi Follow up if new/worsening symptom(s)      Lucie Buttner, PA-C

## 2024-09-28 ENCOUNTER — Ambulatory Visit: Payer: Self-pay | Admitting: Physician Assistant

## 2024-12-27 ENCOUNTER — Encounter: Admitting: Physician Assistant
